# Patient Record
Sex: Female | Born: 1960 | Race: Asian | Hispanic: No | Marital: Married | Smoking: Never smoker
Health system: Southern US, Community
[De-identification: ages and names within clinical notes are randomized; demographics above are authoritative.]

## PROBLEM LIST (undated history)

## (undated) DIAGNOSIS — E785 Hyperlipidemia, unspecified: Secondary | ICD-10-CM

## (undated) DIAGNOSIS — I10 Essential (primary) hypertension: Secondary | ICD-10-CM

## (undated) HISTORY — DX: Essential (primary) hypertension: I10

## (undated) HISTORY — DX: Hyperlipidemia, unspecified: E78.5

---

## 2003-05-27 ENCOUNTER — Other Ambulatory Visit: Admission: RE | Admit: 2003-05-27 | Discharge: 2003-05-27 | Payer: Self-pay | Admitting: Gynecology

## 2005-11-29 ENCOUNTER — Other Ambulatory Visit: Admission: RE | Admit: 2005-11-29 | Discharge: 2005-11-29 | Payer: Self-pay | Admitting: Gynecology

## 2005-12-23 ENCOUNTER — Ambulatory Visit (HOSPITAL_COMMUNITY): Admission: RE | Admit: 2005-12-23 | Discharge: 2005-12-23 | Payer: Self-pay | Admitting: Gynecology

## 2006-12-27 ENCOUNTER — Encounter: Admission: RE | Admit: 2006-12-27 | Discharge: 2006-12-27 | Payer: Self-pay | Admitting: Family Medicine

## 2008-01-30 ENCOUNTER — Ambulatory Visit (HOSPITAL_COMMUNITY): Admission: RE | Admit: 2008-01-30 | Discharge: 2008-01-30 | Payer: Self-pay | Admitting: Obstetrics & Gynecology

## 2008-03-12 ENCOUNTER — Encounter: Admission: RE | Admit: 2008-03-12 | Discharge: 2008-03-12 | Payer: Self-pay | Admitting: Family Medicine

## 2009-03-05 ENCOUNTER — Encounter: Admission: RE | Admit: 2009-03-05 | Discharge: 2009-03-05 | Payer: Self-pay | Admitting: Family Medicine

## 2009-09-25 ENCOUNTER — Encounter: Admission: RE | Admit: 2009-09-25 | Discharge: 2009-09-25 | Payer: Self-pay | Admitting: Family Medicine

## 2010-02-04 ENCOUNTER — Ambulatory Visit: Payer: Self-pay | Admitting: Internal Medicine

## 2010-02-04 LAB — CONVERTED CEMR LAB
AST: 23 units/L (ref 0–37)
Basophils Relative: 1 % (ref 0–1)
CO2: 22 meq/L (ref 19–32)
Calcium: 9.1 mg/dL (ref 8.4–10.5)
Cholesterol: 223 mg/dL — ABNORMAL HIGH (ref 0–200)
Eosinophils Relative: 5 % (ref 0–5)
HCT: 40.7 % (ref 36.0–46.0)
Lymphocytes Relative: 33 % (ref 12–46)
Lymphs Abs: 1.4 10*3/uL (ref 0.7–4.0)
MCHC: 32.7 g/dL (ref 30.0–36.0)
MCV: 94 fL (ref 78.0–100.0)
Monocytes Absolute: 0.2 10*3/uL (ref 0.1–1.0)
Platelets: 260 10*3/uL (ref 150–400)
Potassium: 4 meq/L (ref 3.5–5.3)
Total CHOL/HDL Ratio: 3.3
Total Protein: 8 g/dL (ref 6.0–8.3)
VLDL: 10 mg/dL (ref 0–40)
WBC: 4.2 10*3/uL (ref 4.0–10.5)

## 2010-03-05 ENCOUNTER — Encounter (INDEPENDENT_AMBULATORY_CARE_PROVIDER_SITE_OTHER): Payer: Self-pay | Admitting: Internal Medicine

## 2010-03-05 LAB — CONVERTED CEMR LAB: Calcium: 9.7 mg/dL (ref 8.4–10.5)

## 2010-03-09 ENCOUNTER — Ambulatory Visit (HOSPITAL_COMMUNITY): Admission: RE | Admit: 2010-03-09 | Discharge: 2010-03-09 | Payer: Self-pay | Admitting: Internal Medicine

## 2010-07-05 ENCOUNTER — Encounter: Payer: Self-pay | Admitting: *Deleted

## 2010-10-12 ENCOUNTER — Other Ambulatory Visit: Payer: Self-pay | Admitting: Family Medicine

## 2011-03-01 ENCOUNTER — Other Ambulatory Visit (HOSPITAL_COMMUNITY): Payer: Self-pay | Admitting: Family Medicine

## 2011-03-01 DIAGNOSIS — Z1231 Encounter for screening mammogram for malignant neoplasm of breast: Secondary | ICD-10-CM

## 2011-03-15 ENCOUNTER — Ambulatory Visit (HOSPITAL_COMMUNITY)
Admission: RE | Admit: 2011-03-15 | Discharge: 2011-03-15 | Disposition: A | Discharge status: Home / Self Care | Payer: Self-pay | Source: Ambulatory Visit | Attending: Family Medicine | Admitting: Family Medicine

## 2011-03-15 DIAGNOSIS — Z1231 Encounter for screening mammogram for malignant neoplasm of breast: Secondary | ICD-10-CM

## 2011-03-19 ENCOUNTER — Other Ambulatory Visit: Payer: Self-pay | Admitting: Family Medicine

## 2011-03-19 DIAGNOSIS — R928 Other abnormal and inconclusive findings on diagnostic imaging of breast: Secondary | ICD-10-CM

## 2011-05-21 ENCOUNTER — Ambulatory Visit
Admission: RE | Admit: 2011-05-21 | Discharge: 2011-05-21 | Disposition: A | Discharge status: Home / Self Care | Payer: No Typology Code available for payment source | Source: Ambulatory Visit | Attending: Family Medicine | Admitting: Family Medicine

## 2011-05-21 DIAGNOSIS — R928 Other abnormal and inconclusive findings on diagnostic imaging of breast: Secondary | ICD-10-CM

## 2012-12-26 ENCOUNTER — Other Ambulatory Visit (HOSPITAL_COMMUNITY): Payer: Self-pay | Admitting: Nurse Practitioner

## 2012-12-26 DIAGNOSIS — Z1231 Encounter for screening mammogram for malignant neoplasm of breast: Secondary | ICD-10-CM

## 2013-01-12 ENCOUNTER — Ambulatory Visit: Payer: No Typology Code available for payment source | Attending: Family Medicine | Admitting: Internal Medicine

## 2013-01-12 ENCOUNTER — Ambulatory Visit: Payer: Self-pay | Attending: Family Medicine

## 2013-01-12 VITALS — BP 157/90 | HR 82 | Temp 97.7°F | Resp 18 | Wt 114.2 lb

## 2013-01-12 DIAGNOSIS — M25572 Pain in left ankle and joints of left foot: Secondary | ICD-10-CM

## 2013-01-12 DIAGNOSIS — M25579 Pain in unspecified ankle and joints of unspecified foot: Secondary | ICD-10-CM

## 2013-01-12 DIAGNOSIS — I10 Essential (primary) hypertension: Secondary | ICD-10-CM

## 2013-01-12 LAB — COMPREHENSIVE METABOLIC PANEL
ALT: 19 U/L (ref 0–35)
Alkaline Phosphatase: 51 U/L (ref 39–117)
BUN: 11 mg/dL (ref 6–23)
CO2: 25 mEq/L (ref 19–32)
Potassium: 3.8 mEq/L (ref 3.5–5.3)
Sodium: 138 mEq/L (ref 135–145)
Total Protein: 8.1 g/dL (ref 6.0–8.3)

## 2013-01-12 MED ORDER — LISINOPRIL 20 MG PO TABS: 20.0000 mg | ORAL_TABLET | Freq: Every day | ORAL | Status: DC

## 2013-01-12 NOTE — Progress Notes (Signed)
Patient ID: Amber Grant, female   DOB: 01/16/1961, 52 y.o.   MRN: [id number]  CC:  HPI:  52 year old female who is here to establish care She brings in records from [facility] She says that she has a upcoming appointment for a mammogram on August 4 She is up-to-date with her Pap smear She has no history of smoking or alcohol use She denies any chest pain any shortness of breath She has a history of hypertension and is compliant with taking her medications     No Known Allergies Past Medical History  Diagnosis Date  . Hypertension    No current outpatient prescriptions on file prior to visit.   No current facility-administered medications on file prior to visit.   History reviewed. No pertinent family history. History   Social History  . Marital Status: Single    Spouse Name: N/A    Number of Children: N/A  . Years of Education: N/A   Occupational History  . Not on file.   Social History Main Topics  . Smoking status: Not on file  . Smokeless tobacco: Not on file  . Alcohol Use: Not on file  . Drug Use: Not on file  . Sexually Active: Not on file   Other Topics Concern  . Not on file   Social History Narrative  . No narrative on file    Review of Systems  Constitutional: Negative for fever, chills, diaphoresis, activity change, appetite change and fatigue.  HENT: Negative for ear pain, nosebleeds, congestion, facial swelling, rhinorrhea, neck pain, neck stiffness and ear discharge.   Eyes: Negative for pain, discharge, redness, itching and visual disturbance.  Respiratory: Negative for cough, choking, chest tightness, shortness of breath, wheezing and stridor.   Cardiovascular: Negative for chest pain, palpitations and leg swelling.  Gastrointestinal: Negative for abdominal distention.  Genitourinary: Negative for dysuria, urgency, frequency, hematuria, flank pain, decreased urine volume, difficulty urinating and dyspareunia.  Musculoskeletal: Negative for  back pain, joint swelling, arthralgias and gait problem.  Neurological: Negative for dizziness, tremors, seizures, syncope, facial asymmetry, speech difficulty, weakness, light-headedness, numbness and headaches.  Hematological: Negative for adenopathy. Does not bruise/bleed easily.  Psychiatric/Behavioral: Negative for hallucinations, behavioral problems, confusion, dysphoric mood, decreased concentration and agitation.    Objective:   Filed Vitals:   01/12/13 1005  BP: 157/90  Pulse: 82  Temp: 97.7 F (36.5 C)  Resp: 18    Physical Exam  Constitutional: Appears well-developed and well-nourished. No distress.  HENT: Normocephalic. External right and left ear normal. Oropharynx is clear and moist.  Eyes: Conjunctivae and EOM are normal. PERRLA, no scleral icterus.  Neck: Normal ROM. Neck supple. No JVD. No tracheal deviation. No thyromegaly.  CVS: RRR, S1/S2 +, no murmurs, no gallops, no carotid bruit.  Pulmonary: Effort and breath sounds normal, no stridor, rhonchi, wheezes, rales.  Abdominal: Soft. BS +,  no distension, tenderness, rebound or guarding.  Musculoskeletal: Normal range of motion. No edema and no tenderness.  Lymphadenopathy: No lymphadenopathy noted, cervical, inguinal. Neuro: Alert. Normal reflexes, muscle tone coordination. No cranial nerve deficit. Skin: Skin is warm and dry. No rash noted. Not diaphoretic. No erythema. No pallor.  Psychiatric: Normal mood and affect. Behavior, judgment, thought content normal.   Lab Results  Component Value Date   WBC 4.2 02/04/2010   HGB 13.3 02/04/2010   HCT 40.7 02/04/2010   MCV 94.0 02/04/2010   PLT 260 02/04/2010   Lab Results  Component Value Date   CREATININE 0.62 03/05/2010  BUN 8 03/05/2010   NA 144 03/05/2010   K 4.7 03/05/2010   CL 105 03/05/2010   CO2 28 03/05/2010    No results found for this basename: HGBA1C   Lipid Panel     Component Value Date/Time   CHOL 223* 02/04/2010 2045   TRIG 50 02/04/2010 2045    HDL 67 02/04/2010 2045   CHOLHDL 3.3 Ratio 02/04/2010 2045   VLDL 10 02/04/2010 2045   LDLCALC 146* 02/04/2010 2045       Assessment and plan:   There are no active problems to display for this patient.  Hypertension Refill lisinopril  Healthcare maintenance Obtain blood work, CBC, CMP, hemoglobin A1c, lipid panel Mammogram on 8/4 according to the patient Patient follows up with women's health and will follow up with them for her regular Pap smears   Patient instructed to call in a couple of days to obtain the results of the blood work  Otherwise followup in one month

## 2013-01-12 NOTE — Progress Notes (Signed)
Patient has a history of HTN Needs medication refill

## 2013-01-12 NOTE — Patient Instructions (Signed)
Patient instructed to call back in 2 days, to obtain results of her blood work

## 2013-01-15 ENCOUNTER — Ambulatory Visit (HOSPITAL_COMMUNITY)
Admission: RE | Admit: 2013-01-15 | Discharge: 2013-01-15 | Disposition: A | Discharge status: Home / Self Care | Payer: Self-pay | Source: Ambulatory Visit | Attending: Nurse Practitioner | Admitting: Nurse Practitioner

## 2013-01-15 DIAGNOSIS — Z1231 Encounter for screening mammogram for malignant neoplasm of breast: Secondary | ICD-10-CM

## 2013-02-16 ENCOUNTER — Ambulatory Visit: Payer: No Typology Code available for payment source | Attending: Internal Medicine | Admitting: Internal Medicine

## 2013-02-16 VITALS — BP 165/91 | HR 78 | Temp 98.3°F | Resp 17 | Wt 114.2 lb

## 2013-02-16 DIAGNOSIS — R51 Headache: Secondary | ICD-10-CM | POA: Insufficient documentation

## 2013-02-16 DIAGNOSIS — R040 Epistaxis: Secondary | ICD-10-CM

## 2013-02-16 DIAGNOSIS — I10 Essential (primary) hypertension: Secondary | ICD-10-CM | POA: Insufficient documentation

## 2013-02-16 LAB — LIPID PANEL: Cholesterol: 251 mg/dL — ABNORMAL HIGH (ref 0–200)

## 2013-02-16 LAB — CBC WITH DIFFERENTIAL/PLATELET
Eosinophils Absolute: 0.2 10*3/uL (ref 0.0–0.7)
HCT: 39.5 % (ref 36.0–46.0)
Hemoglobin: 13.5 g/dL (ref 12.0–15.0)
Lymphs Abs: 2.2 10*3/uL (ref 0.7–4.0)
MCHC: 34.2 g/dL (ref 30.0–36.0)
Neutrophils Relative %: 43 % (ref 43–77)
Platelets: 304 10*3/uL (ref 150–400)
RBC: 4.51 MIL/uL (ref 3.87–5.11)
RDW: 13.4 % (ref 11.5–15.5)
WBC: 4.8 10*3/uL (ref 4.0–10.5)

## 2013-02-16 MED ORDER — LISINOPRIL 40 MG PO TABS: 40.0000 mg | ORAL_TABLET | Freq: Every day | ORAL | Status: DC

## 2013-02-16 MED ORDER — OMEGA-3 FATTY ACIDS 1000 MG PO CAPS: 2.0000 g | ORAL_CAPSULE | Freq: Every day | ORAL | Status: DC

## 2013-02-16 NOTE — Progress Notes (Signed)
PATIENT here for headaches and repeated nose bleeds Has been going on for about 3 months

## 2013-02-16 NOTE — Progress Notes (Signed)
Patient ID: Amber Grant, female   DOB: 03/31/1961, 52 y.o.   MRN: [id number] Patient Demographics  Amber Grant, is a 52 y.o. female  [id number]  [id number]  DOB - 1960-11-14  Chief Complaint  Patient presents with  . Headache        Subjective:   Amber Grant today is here for a follow up visit. Patient reports that she's been having intermittent epistaxis, her BP has been running high, intermittent headaches. No blurred vision or any chest pain. No allergies or sinusitis. She states that epistaxis is from the right nostril, usually spontaneously resolves and is mild.  Patient has No chest pain, No abdominal pain - No Nausea, No new weakness tingling or numbness, No Cough - SOB.  Objective:    Filed Vitals:   02/16/13 0906  BP: 165/91  Pulse: 78  Temp: 98.3 F (36.8 C)  Resp: 17  Weight: 114 lb 3.2 oz (51.801 kg)  SpO2: 100%     ALLERGIES:  No Known Allergies  PAST MEDICAL HISTORY: Past Medical History  Diagnosis Date  . Hypertension     MEDICATIONS AT HOME: Prior to Admission medications   Medication Sig Start Date End Date Taking? Authorizing Provider  fish oil-omega-3 fatty acids 1000 MG capsule Take 2 capsules (2 g total) by mouth daily. 02/16/13   Madge Therrien Jenna Luo, MD  lisinopril (PRINIVIL,ZESTRIL) 40 MG tablet Take 1 tablet (40 mg total) by mouth daily. 02/16/13   Prabhav Faulkenberry Jenna Luo, MD  Multiple Vitamins-Minerals (MULTIVITAMIN PO) Take by mouth.    Historical Provider, MD     Exam  General appearance :Awake, alert, NAD, Speech Clear.  HEENT: Atraumatic and Normocephalic, PERLA. Both nostrils clean no polyps, no dry blood noted Neck: supple, no JVD. No cervical lymphadenopathy.  Chest: Clear to auscultation bilaterally, no wheezing, rales or rhonchi CVS: S1 S2 regular, no murmurs.  Abdomen: soft, NBS, NT, ND, no gaurding, rigidity or rebound. Extremities: no cyanosis or clubbing, B/L Lower Ext shows no edema Neurology: Awake alert, and oriented X 3, CN  II-XII intact, Non focal Skin: No Rash or lesions Wounds:N/A    Data Review   Basic Metabolic Panel: No results found for this basename: NA, K, CL, CO2, GLUCOSE, BUN, CREATININE, CALCIUM, MG, PHOS,  in the last 168 hours Liver Function Tests: No results found for this basename: AST, ALT, ALKPHOS, BILITOT, PROT, ALBUMIN,  in the last 168 hours  CBC: No results found for this basename: WBC, NEUTROABS, HGB, HCT, MCV, PLT,  in the last 168 hours  ------------------------------------------------------------------------------------------------------------------ No results found for this basename: HGBA1C,  in the last 72 hours ------------------------------------------------------------------------------------------------------------------ No results found for this basename: CHOL, HDL, LDLCALC, TRIG, CHOLHDL, LDLDIRECT,  in the last 72 hours ------------------------------------------------------------------------------------------------------------------ No results found for this basename: TSH, T4TOTAL, FREET3, T3FREE, THYROIDAB,  in the last 72 hours ------------------------------------------------------------------------------------------------------------------ No results found for this basename: VITAMINB12, FOLATE, FERRITIN, TIBC, IRON, RETICCTPCT,  in the last 72 hours  Coagulation profile  No results found for this basename: INR, PROTIME,  in the last 168 hours    Assessment & Plan   Active Problems: Intermittent epistaxis with headaches and uncontrolled hypertension - Will increase lisinopril to 40 mg daily - Patient advised to STOP aspirin - Recommended patient to check BP every morning, not blow her nose. - Check CBC, lipid panel today - If no significant improvement despite good BP control, will obtain ENT consult next visit  History of hyperlipidemia:  - Patient has been on  omega-3 fatty acids - Check lipid panel today  Recommendations: CBC, lipid panel Follow-up in  2 months     Obi Scrima M.D. 02/16/2013, 9:19 AM

## 2013-02-20 ENCOUNTER — Telehealth: Payer: Self-pay | Admitting: Emergency Medicine

## 2013-02-20 MED ORDER — SIMVASTATIN 40 MG PO TABS: 40.0000 mg | ORAL_TABLET | Freq: Every day | ORAL | Status: DC

## 2013-02-20 NOTE — Telephone Encounter (Signed)
Pt told to pick Zocor script from [facility]

## 2013-02-20 NOTE — Progress Notes (Signed)
Quick Note:  Please order zocor (simvastatin) 40mg  at bed time with 3 refills. ______

## 2013-03-19 ENCOUNTER — Ambulatory Visit: Payer: No Typology Code available for payment source

## 2013-04-19 ENCOUNTER — Encounter: Payer: Self-pay | Admitting: Internal Medicine

## 2013-04-19 ENCOUNTER — Ambulatory Visit: Payer: No Typology Code available for payment source | Attending: Internal Medicine | Admitting: Internal Medicine

## 2013-04-19 VITALS — BP 126/84 | HR 90 | Temp 98.7°F | Resp 16 | Ht 60.0 in | Wt 119.0 lb

## 2013-04-19 DIAGNOSIS — R040 Epistaxis: Secondary | ICD-10-CM

## 2013-04-19 DIAGNOSIS — I10 Essential (primary) hypertension: Secondary | ICD-10-CM | POA: Insufficient documentation

## 2013-04-19 LAB — APTT: aPTT: 33 seconds (ref 24–37)

## 2013-04-19 LAB — HEPATIC FUNCTION PANEL
ALT: 17 U/L (ref 0–35)
Alkaline Phosphatase: 46 U/L (ref 39–117)
Bilirubin, Direct: 0.1 mg/dL (ref 0.0–0.3)
Indirect Bilirubin: 0.5 mg/dL (ref 0.0–0.9)

## 2013-04-19 LAB — PROTIME-INR: INR: 0.97 (ref <1.50)

## 2013-04-19 MED ORDER — OMEGA-3 FATTY ACIDS 1000 MG PO CAPS: 2.0000 g | ORAL_CAPSULE | Freq: Every day | ORAL | Status: DC

## 2013-04-19 MED ORDER — SIMVASTATIN 40 MG PO TABS: 40.0000 mg | ORAL_TABLET | Freq: Every day | ORAL | Status: DC

## 2013-04-19 MED ORDER — LISINOPRIL 40 MG PO TABS: 40.0000 mg | ORAL_TABLET | Freq: Every day | ORAL | Status: DC

## 2013-04-19 NOTE — Progress Notes (Signed)
Patient ID: Amber Grant, female   DOB: 07/27/1960, 52 y.o.   MRN: [id number] Patient Demographics  Amber Grant, is a 52 y.o. female  [id number]  [id number]  DOB - 05/31/1961  Chief Complaint  Patient presents with  . Follow-up        Subjective:   Amber Grant is a 52 y.o. female here today for a follow up visit. Still has epistaxis occasionally, last episode was about a week ago, blood pressure now controlled. No new complaints. She needs refill on her medications Patient has No headache, No chest pain, No abdominal pain - No Nausea, No new weakness tingling or numbness, No Cough - SOB.  ALLERGIES: No Known Allergies  PAST MEDICAL HISTORY: Past Medical History  Diagnosis Date  . Hypertension     MEDICATIONS AT HOME: Prior to Admission medications   Medication Sig Start Date End Date Taking? Authorizing Provider  fish oil-omega-3 fatty acids 1000 MG capsule Take 2 capsules (2 g total) by mouth daily. 02/16/13  Yes Ripudeep Jenna Luo, MD  lisinopril (PRINIVIL,ZESTRIL) 40 MG tablet Take 1 tablet (40 mg total) by mouth daily. 02/16/13  Yes Ripudeep Jenna Luo, MD  Multiple Vitamins-Minerals (MULTIVITAMIN PO) Take by mouth.   Yes Historical Provider, MD  simvastatin (ZOCOR) 40 MG tablet Take 1 tablet (40 mg total) by mouth at bedtime. 02/20/13   Ripudeep Jenna Luo, MD     Objective:   Filed Vitals:   04/19/13 1032  BP: 126/84  Pulse: 90  Temp: 98.7 F (37.1 C)  TempSrc: Oral  Resp: 16  Height: 5' (1.524 m)  Weight: 119 lb (53.978 kg)  SpO2: 99%    Exam General appearance : Awake, alert, not in any distress. Speech Clear. Not toxic looking HEENT: Atraumatic and Normocephalic, pupils equally reactive to light and accomodation Neck: supple, no JVD. No cervical lymphadenopathy.  Chest:Good air entry bilaterally, no added sounds  CVS: S1 S2 regular, no murmurs.  Abdomen: Bowel sounds present, Non tender and not distended with no gaurding, rigidity or rebound. Extremities: B/L  Lower Ext shows no edema, both legs are warm to touch Neurology: Awake alert, and oriented X 3, CN II-XII intact, Non focal Skin:No Rash Wounds:N/A   Data Review   CBC No results found for this basename: WBC, HGB, HCT, PLT, MCV, MCH, MCHC, RDW, NEUTRABS, LYMPHSABS, MONOABS, EOSABS, BASOSABS, BANDABS, BANDSABD,  in the last 168 hours  Chemistries   No results found for this basename: NA, K, CL, CO2, GLUCOSE, BUN, CREATININE, GFRCGP, CALCIUM, MG, AST, ALT, ALKPHOS, BILITOT,  in the last 168 hours ------------------------------------------------------------------------------------------------------------------ No results found for this basename: HGBA1C,  in the last 72 hours ------------------------------------------------------------------------------------------------------------------ No results found for this basename: CHOL, HDL, LDLCALC, TRIG, CHOLHDL, LDLDIRECT,  in the last 72 hours ------------------------------------------------------------------------------------------------------------------ No results found for this basename: TSH, T4TOTAL, FREET3, T3FREE, THYROIDAB,  in the last 72 hours ------------------------------------------------------------------------------------------------------------------ No results found for this basename: VITAMINB12, FOLATE, FERRITIN, TIBC, IRON, RETICCTPCT,  in the last 72 hours  Coagulation profile  No results found for this basename: INR, PROTIME,  in the last 168 hours    Assessment & Plan   Patient Active Problem List   Diagnosis Date Noted  . Essential hypertension, benign 02/16/2013  . Epistaxis-mild intermittent 02/16/2013     Plan: Liver function test PT/INR APTT  Patient counseled extensively about nutrition and exercise Patient advised not to use aspirin or over-the-counter NSAID Patient advised not to below her nose, and wipe gently if rhinorrhea  Follow up in 4 weeks   The patient was given clear instructions to go  to ER or return to medical center if symptoms don't improve, worsen or new problems develop. The patient verbalized understanding. The patient was told to call to get lab results if they haven't heard anything in the next week.    Jeanann Lewandowsky, MD, MHA, FACP, FAAP [facility] [place], [place] [phone]   04/19/2013, 11:13 AM

## 2013-04-19 NOTE — Progress Notes (Signed)
Pt is here for a f/u visit. Pt reports that for three months she has been having epistaxis.  Marland Kitchen

## 2013-04-19 NOTE — Patient Instructions (Signed)
Ch?y Máu Cam  (Nosebleed)  Ch?y máu cam có th? do nhi?u tình tr?ng bao g?m ch?n th??ng, nhi?m trùng, pô líp, d? v?t, niêm m?c ho?c khí h?u khô, thu?c và ?i?u hòa không khí. H?u h?t ch?y máu cam x?y ra ? ph?n tr??c m?i. Vì ? v? trí này nên h?u h?t ch?y máu cam có th? ???c ki?m soát b?ng k?p l? m?i nh? nhàng và liên t?c. Làm ?i?u này trong ít nh?t là 10 ??n 20 phút. Lý do s? d?ng áp l?c này liên t?c và lâu là vì b?n ph?i gi? nó ?? lâu ?? máu ?ông l?i. N?u trong kho?ng 10 ??n 20 phút ?ó, áp l?c không ???c duy trì, quá trình này có th? ph?i b?t ??u l?i. Vi?c ch?y máu cam có th? t? d?ng, h?t ch?y do ép, c?n nhi?t t?p trung (??t) ho?c d?ng khi ép b?ng kh?n ??t.  H??NG D?N CH?M SÓC T?I NHÀ  · N?u m?i ???c nút l?i, b?n hãy c? g?ng gi? nguyên nút ? bên trong cho ??n khi chuyên gia ch?m sóc s?c kh?e l?y ra. N?u gói g?c ?ã ???c s? d?ng và nó b?t ??u r?i ra, hãy nh? nhàng thay ho?c c?t b? ??u. Không c?t n?u m?t ?ng có bóng chèn ???c s? d?ng ?? bó m?i. N?u không, không ???c tháo tr? khi ???c ch? d?n.  · Tránh xì m?i trong 12 gi? sau khi ?i?u tr?. Làm nh? v?y có th? làm b?t nút ho?c c?c chèn và s? ch?y máu l?i.  · N?u l?i ch?y máu l?i, hãy ??ng d?y và cúi ng??i v? phía tr??c, nh? nhàng bóp ph?n n?a tr??c c?a m?i liên t?c trong hai m??i phút.  · N?u ch?y máu là do l?p màng nh?y khô, m?i sáng dùng ??u ngón út ?? bôi m? vaseline ho?c thu?c m? kháng sinh ph?n bên trong m?i. Làm nh? v?y khi c?n thi?t khi th?i ti?t khô. Cách này s? giúp gi? l?p niêm m?c ?m ??t và giúp chúng lành l?i.  · Duy trì ?? ?m trong nhà b?n b?ng cách s? d?ng máy ?i?u hoà nhi?t ?? ít h?n ho?c dùng máy t?o ?? ?m.  · Không u?ng aspirin ho?c nh?ng lo?i thu?c d? làm ch?y máu. Chuyên gia ch?m sóc s?c kh?e c?a b?n có th? khuyên b?n ?i?u này.  · Tr? l?i sinh ho?t bình th??ng khi có th?, nh?ng c? g?ng tránh c?ng th?ng, nâng ho?c cong ng??i ? ph?n th?t l?ng trong vài ngày.  · N?u b?nh ch?y máu cam c?a b?n b?t ??u tái phát và không có nguyên nhân rõ ràng, chuyên gia ch?m  sóc s?c kh?e c?a b?n có th? ?? ngh? làm xét nghi?m.  HÃY ??N KHÁM B?NH NGAY L?P T?C N?U:  · Ch?y máu l?i tái phát và không th? ki?m soát ?u?c.  · Có hi?n t??ng ch?y máu ho?c thâm tím b?t th??ng trên nh?ng b? ph?n khác c?a c? th?.  · B?n b? s?t.  · V?n ti?p t?c ch?y máu cam.  · Tình tr?ng làm b?n ph?i ?i khám lúc ??u ngày càng tr?m tr?ng h?n.  · B?n b? choáng váng, c?m th?y nh? b? ng?t, ?? m? hôi, ho?c nôn ra máu.  HÃY CH?C CH?N R?NG B?N:  · Hi?u rõ nh?ng h??ng d?n này.  · S? theo dõi tình tr?ng b?nh c?a b?n.  · S? yêu c?u tr? giúp ngay l?p t?c n?u b?n không ?? ho?c tình tr?ng tr?m tr?ng h?n.  Document Released:   03/10/2005 Document Revised: 01/31/2013  [employer]® Patient Information ©2014 [employer].

## 2013-04-20 ENCOUNTER — Telehealth: Payer: Self-pay | Admitting: Emergency Medicine

## 2013-04-20 NOTE — Telephone Encounter (Signed)
Message copied by Darlis Loan on Fri Apr 20, 2013  2:16 PM ------      Message from: Jeanann Lewandowsky E      Created: Fri Apr 20, 2013 12:00 PM       Please let patient know that her liver function test and coagulation profiles are normal. We advise patient not to blow her nose, don't take aspirin for now, if epistaxis continues, to report back to the clinic ------

## 2013-04-20 NOTE — Telephone Encounter (Signed)
Message copied by Darlis Loan on Fri Apr 20, 2013  2:15 PM ------      Message from: Jeanann Lewandowsky E      Created: Fri Apr 20, 2013 12:00 PM       Please let patient know that her liver function test and coagulation profiles are normal. We advise patient not to blow her nose, don't take aspirin for now, if epistaxis continues, to report back to the clinic ------

## 2013-04-20 NOTE — Telephone Encounter (Signed)
Attempted to reach pt with results. Line is not set up for voicemail. Will try again

## 2013-04-30 ENCOUNTER — Ambulatory Visit: Payer: No Typology Code available for payment source | Attending: Internal Medicine

## 2013-05-24 ENCOUNTER — Encounter: Payer: Self-pay | Admitting: Internal Medicine

## 2013-05-24 ENCOUNTER — Ambulatory Visit: Payer: No Typology Code available for payment source | Attending: Internal Medicine | Admitting: Internal Medicine

## 2013-05-24 VITALS — BP 150/89 | HR 90 | Temp 98.2°F | Resp 16 | Ht 60.0 in | Wt 119.0 lb

## 2013-05-24 DIAGNOSIS — R040 Epistaxis: Secondary | ICD-10-CM

## 2013-05-24 DIAGNOSIS — I10 Essential (primary) hypertension: Secondary | ICD-10-CM | POA: Insufficient documentation

## 2013-05-24 NOTE — Progress Notes (Signed)
Pt is here following up on her HTN and epistaxis. Today she reports that the nose bleeds are getting better but the is still some bleeding. Pt has an interpretor with her today.

## 2013-05-24 NOTE — Progress Notes (Signed)
Patient ID: Amber Grant, female   DOB: October 04, 1960, 52 y.o.   MRN: [id number] Patient Demographics  Amber Grant, is a 52 y.o. female  [id number]  [id number]  DOB - August 03, 1960  Chief Complaint  Patient presents with  . Follow-up        Subjective:   Amber Grant is a 52 y.o. female here today for a follow up visit. Patient continue to have epistaxis but the frequency is reduced, small quantity of bleeding at the time. Patient claims her blood pressure is better controlled at this time, but preparing for this clinic visit she did not sleep last night, she thinks that may be the result of her blood pressure being high. She has no new complaints. No headache. No bleeding anywhere else. She does not smoke cigarette she does not drink alcohol. Patient has No headache, No chest pain, No abdominal pain - No Nausea, No new weakness tingling or numbness, No Cough - SOB.  ALLERGIES: No Known Allergies  PAST MEDICAL HISTORY: Past Medical History  Diagnosis Date  . Hypertension     MEDICATIONS AT HOME: Prior to Admission medications   Medication Sig Start Date End Date Taking? Authorizing Provider  fish oil-omega-3 fatty acids 1000 MG capsule Take 2 capsules (2 g total) by mouth daily. 04/19/13  Yes Jeanann Lewandowsky, MD  lisinopril (PRINIVIL,ZESTRIL) 40 MG tablet Take 1 tablet (40 mg total) by mouth daily. 04/19/13  Yes Jeanann Lewandowsky, MD  Multiple Vitamins-Minerals (MULTIVITAMIN PO) Take by mouth.   Yes Historical Provider, MD  simvastatin (ZOCOR) 40 MG tablet Take 1 tablet (40 mg total) by mouth at bedtime. 04/19/13   Jeanann Lewandowsky, MD     Objective:   Filed Vitals:   05/24/13 0908  BP: 150/89  Pulse: 90  Temp: 98.2 F (36.8 C)  TempSrc: Oral  Resp: 16  Height: 5' (1.524 m)  Weight: 119 lb (53.978 kg)  SpO2: 98%    Exam General appearance : Awake, alert, not in any distress. Speech Clear. Not toxic looking HEENT: Atraumatic and Normocephalic, pupils equally  reactive to light and accomodation, small area of bleeding/abrasion seen in both nostrils. Neck: supple, no JVD. No cervical lymphadenopathy.  Chest:Good air entry bilaterally, no added sounds  CVS: S1 S2 regular, no murmurs.  Abdomen: Bowel sounds present, Non tender and not distended with no gaurding, rigidity or rebound. Extremities: B/L Lower Ext shows no edema, both legs are warm to touch Neurology: Awake alert, and oriented X 3, CN II-XII intact, Non focal Skin:No Rash Wounds:N/A   Data Review   CBC No results found for this basename: WBC, HGB, HCT, PLT, MCV, MCH, MCHC, RDW, NEUTRABS, LYMPHSABS, MONOABS, EOSABS, BASOSABS, BANDABS, BANDSABD,  in the last 168 hours  Chemistries   No results found for this basename: NA, K, CL, CO2, GLUCOSE, BUN, CREATININE, GFRCGP, CALCIUM, MG, AST, ALT, ALKPHOS, BILITOT,  in the last 168 hours ------------------------------------------------------------------------------------------------------------------ No results found for this basename: HGBA1C,  in the last 72 hours ------------------------------------------------------------------------------------------------------------------ No results found for this basename: CHOL, HDL, LDLCALC, TRIG, CHOLHDL, LDLDIRECT,  in the last 72 hours ------------------------------------------------------------------------------------------------------------------ No results found for this basename: TSH, T4TOTAL, FREET3, T3FREE, THYROIDAB,  in the last 72 hours ------------------------------------------------------------------------------------------------------------------ No results found for this basename: VITAMINB12, FOLATE, FERRITIN, TIBC, IRON, RETICCTPCT,  in the last 72 hours  Coagulation profile  No results found for this basename: INR, PROTIME,  in the last 168 hours    Assessment & Plan   1. Essential hypertension, benign  Continue lisinopril 40 mg tablet by mouth daily Continue other medications  for dyslipidemia  2. Epistaxis All coagulation profile were within normal limits - Ambulatory referral to ENT   Follow up in 3 months or when necessary Interpreter was used to communicate directly with patient for the entire encounter including providing detailed patient instructions.   The patient was given clear instructions to go to ER or return to medical center if symptoms don't improve, worsen or new problems develop. The patient verbalized understanding. The patient was told to call to get lab results if they haven't heard anything in the next week.    Jeanann Lewandowsky, MD, MHA, FACP, FAAP [facility] [place], [place] [phone]   05/24/2013, 10:03 AM

## 2013-06-04 ENCOUNTER — Ambulatory Visit: Payer: No Typology Code available for payment source | Attending: Internal Medicine

## 2013-08-23 ENCOUNTER — Encounter: Payer: Self-pay | Admitting: Pharmacist

## 2013-08-23 ENCOUNTER — Ambulatory Visit: Payer: Self-pay

## 2013-08-23 ENCOUNTER — Ambulatory Visit: Payer: No Typology Code available for payment source | Attending: Internal Medicine | Admitting: Pharmacist

## 2013-08-23 VITALS — BP 115/74 | HR 72 | Ht 61.0 in | Wt 117.0 lb

## 2013-08-23 DIAGNOSIS — Z0000 Encounter for general adult medical examination without abnormal findings: Secondary | ICD-10-CM

## 2013-08-23 DIAGNOSIS — M25579 Pain in unspecified ankle and joints of unspecified foot: Secondary | ICD-10-CM

## 2013-08-23 DIAGNOSIS — M25572 Pain in left ankle and joints of left foot: Secondary | ICD-10-CM

## 2013-08-23 DIAGNOSIS — I10 Essential (primary) hypertension: Secondary | ICD-10-CM

## 2013-08-23 LAB — CBC WITH DIFFERENTIAL/PLATELET
BASOS ABS: 0 10*3/uL (ref 0.0–0.1)
BASOS PCT: 1 % (ref 0–1)
EOS ABS: 0.2 10*3/uL (ref 0.0–0.7)
Eosinophils Relative: 4 % (ref 0–5)
HCT: 38.9 % (ref 36.0–46.0)
HEMOGLOBIN: 13.3 g/dL (ref 12.0–15.0)
Lymphocytes Relative: 42 % (ref 12–46)
Lymphs Abs: 1.8 10*3/uL (ref 0.7–4.0)
MCH: 30.2 pg (ref 26.0–34.0)
MCHC: 34.2 g/dL (ref 30.0–36.0)
MCV: 88.4 fL (ref 78.0–100.0)
MONOS PCT: 6 % (ref 3–12)
Monocytes Absolute: 0.3 10*3/uL (ref 0.1–1.0)
NEUTROS ABS: 2 10*3/uL (ref 1.7–7.7)
Neutrophils Relative %: 47 % (ref 43–77)
Platelets: 284 10*3/uL (ref 150–400)
RBC: 4.4 MIL/uL (ref 3.87–5.11)
RDW: 13.3 % (ref 11.5–15.5)
WBC: 4.2 10*3/uL (ref 4.0–10.5)

## 2013-08-23 LAB — LIPID PANEL
Cholesterol: 286 mg/dL — ABNORMAL HIGH (ref 0–200)
HDL: 60 mg/dL (ref >39)
LDL CALC: 200 mg/dL — AB (ref 0–99)
Total CHOL/HDL Ratio: 4.8 Ratio
Triglycerides: 131 mg/dL (ref <150)
VLDL: 26 mg/dL (ref 0–40)

## 2013-08-23 LAB — COMPREHENSIVE METABOLIC PANEL
ALT: 22 U/L (ref 0–35)
AST: 22 U/L (ref 0–37)
Albumin: 4.6 g/dL (ref 3.5–5.2)
Alkaline Phosphatase: 44 U/L (ref 39–117)
BUN: 11 mg/dL (ref 6–23)
CALCIUM: 9.3 mg/dL (ref 8.4–10.5)
CHLORIDE: 101 meq/L (ref 96–112)
CO2: 27 meq/L (ref 19–32)
Creat: 0.49 mg/dL — ABNORMAL LOW (ref 0.50–1.10)
Glucose, Bld: 102 mg/dL — ABNORMAL HIGH (ref 70–99)
POTASSIUM: 4.2 meq/L (ref 3.5–5.3)
Sodium: 138 mEq/L (ref 135–145)
Total Bilirubin: 0.6 mg/dL (ref 0.2–1.2)
Total Protein: 7.6 g/dL (ref 6.0–8.3)

## 2013-08-23 LAB — TSH: TSH: 1.976 u[IU]/mL (ref 0.350–4.500)

## 2013-08-23 MED ORDER — LISINOPRIL 40 MG PO TABS: 40.0000 mg | ORAL_TABLET | Freq: Every day | ORAL | Status: DC

## 2013-08-23 NOTE — Progress Notes (Signed)
S:    Patient arrives to the clinic for ambulatory blood pressure evaluation.   Medication compliance is pt taking daily.  Current BP Medications include:  Lisinopril 40 mg.  Antihypertensives tried in the past include: N/A  Patient returned to the clinic and reported no symptoms reported at this time.  O:  Last 3 Office BP readings: 150/89 mmHg 126/84 mmHg 165/91 mmHg  Today's Office BP reading: 115/ mmHg   BMET    Component Value Date/Time   NA 138 01/12/2013 1059   K 3.8 01/12/2013 1059   CL 102 01/12/2013 1059   CO2 25 01/12/2013 1059   GLUCOSE 102* 01/12/2013 1059   BUN 11 01/12/2013 1059   CREATININE 0.58 01/12/2013 1059   CREATININE 0.62 03/05/2010 2028   CALCIUM 9.5 01/12/2013 1059    A/P:  There are no changes at this time.  BP is at goal this visit (<140/90).  Pt has not been taking simvastatin 40 mg because she felt she did not need it. Will obtain labs today CMP, Lipid, TSH, CBC

## 2013-11-30 ENCOUNTER — Ambulatory Visit: Payer: No Typology Code available for payment source

## 2013-12-12 ENCOUNTER — Ambulatory Visit: Payer: Self-pay | Attending: Internal Medicine

## 2014-07-05 ENCOUNTER — Ambulatory Visit: Payer: Self-pay

## 2014-07-11 ENCOUNTER — Ambulatory Visit: Payer: Self-pay | Attending: Internal Medicine

## 2014-07-18 ENCOUNTER — Encounter: Payer: Self-pay | Admitting: Internal Medicine

## 2014-07-18 ENCOUNTER — Ambulatory Visit: Payer: Self-pay | Attending: Internal Medicine | Admitting: Internal Medicine

## 2014-07-18 VITALS — BP 146/89 | HR 76 | Temp 98.1°F | Resp 16 | Ht 61.0 in | Wt 118.0 lb

## 2014-07-18 DIAGNOSIS — I10 Essential (primary) hypertension: Secondary | ICD-10-CM

## 2014-07-18 DIAGNOSIS — Z0000 Encounter for general adult medical examination without abnormal findings: Secondary | ICD-10-CM

## 2014-07-18 DIAGNOSIS — E785 Hyperlipidemia, unspecified: Secondary | ICD-10-CM

## 2014-07-18 MED ORDER — LISINOPRIL 40 MG PO TABS: 40.0000 mg | ORAL_TABLET | Freq: Every day | ORAL | Status: DC

## 2014-07-18 MED ORDER — SIMVASTATIN 40 MG PO TABS: 40.0000 mg | ORAL_TABLET | Freq: Every day | ORAL | Status: DC

## 2014-07-18 NOTE — Progress Notes (Signed)
Pt is here for a physical and a pap smear. Pt has an interpreter.

## 2014-07-18 NOTE — Progress Notes (Signed)
Patient ID: Amber Grant, female   DOB: 04-15-1961, 54 y.o.   MRN: [id number]  CC: physical  HPI: Amber Grant is a postmenopausal 54 y.o. female here today for a follow up visit.  Patient has past medical history of hypertension and hyperlipidemia.  Patient presents today for a physical and pap smear.  Her last mammogram was in 2014 which was normal.  She denies vaginal discharge, itch, odor, lesions, breast masses, or tenderness.  Surgical: c-sections Social: Denies tobacco, drug, or alcohol use. She works part time, is unmarried with 2 children.    Patient has No headache, No chest pain, No abdominal pain - No Nausea, No new weakness tingling or numbness, No Cough - SOB.  No Known Allergies Past Medical History  Diagnosis Date  . Hypertension    Current Outpatient Prescriptions on File Prior to Visit  Medication Sig Dispense Refill  . lisinopril (PRINIVIL,ZESTRIL) 40 MG tablet Take 1 tablet (40 mg total) by mouth daily. 30 tablet 3  . fish oil-omega-3 fatty acids 1000 MG capsule Take 2 capsules (2 g total) by mouth daily. (Patient not taking: Reported on 07/18/2014) 90 capsule 3  . Multiple Vitamins-Minerals (MULTIVITAMIN PO) Take by mouth.    . simvastatin (ZOCOR) 40 MG tablet Take 1 tablet (40 mg total) by mouth at bedtime. (Patient not taking: Reported on 07/18/2014) 90 tablet 3   No current facility-administered medications on file prior to visit.   History reviewed. No pertinent family history. History   Social History  . Marital Status: Single    Spouse Name: N/A    Number of Children: N/A  . Years of Education: N/A   Occupational History  . Not on file.   Social History Main Topics  . Smoking status: Never Smoker   . Smokeless tobacco: Not on file  . Alcohol Use: No  . Drug Use: No  . Sexual Activity: Not on file   Other Topics Concern  . Not on file   Social History Narrative    Review of Systems: Constitutional: Negative for fever, chills, diaphoresis,  activity change, appetite change and fatigue. HENT: Negative for ear pain, nosebleeds, congestion, facial swelling, rhinorrhea, neck pain, neck stiffness and ear discharge.  Eyes: Negative for pain, discharge, redness, itching and visual disturbance. Respiratory: Negative for cough, choking, chest tightness, shortness of breath, wheezing and stridor.  Cardiovascular: Negative for chest pain, palpitations and leg swelling. Gastrointestinal: Negative for abdominal distention. Genitourinary: Negative for dysuria, urgency, frequency, hematuria, flank pain, decreased urine volume, difficulty urinating and dyspareunia.  Musculoskeletal: Negative for back pain, joint swelling, arthralgias and gait problem. Neurological: Negative for dizziness, tremors, seizures, syncope, facial asymmetry, speech difficulty, weakness, light-headedness, numbness and headaches.  Hematological: Negative for adenopathy. Does not bruise/bleed easily. Psychiatric/Behavioral: Negative for hallucinations, behavioral problems, confusion, dysphoric mood, decreased concentration and agitation.    Objective:   Filed Vitals:   07/18/14 1621  BP: 146/89  Pulse: 76  Temp: 98.1 F (36.7 C)  Resp: 16    Physical Exam: Constitutional: Patient appears well-developed and well-nourished. No distress. HENT: Normocephalic, atraumatic, External right and left ear normal. Oropharynx is clear and moist.  Eyes: Conjunctivae and EOM are normal. PERRLA, no scleral icterus. Neck: Normal ROM. Neck supple. No JVD. No tracheal deviation. No thyromegaly. CVS: RRR, S1/S2 +, no murmurs, no gallops, no carotid bruit.  Pulmonary: Effort and breath sounds normal, no stridor, rhonchi, wheezes, rales.  Abdominal: Soft. BS +,  no distension, tenderness, rebound or guarding.  Musculoskeletal:  Normal range of motion. No edema and no tenderness.  Lymphadenopathy: No lymphadenopathy noted, cervical, Neuro: Alert. Normal reflexes, muscle tone  coordination. No cranial nerve deficit. Skin: Skin is warm and dry. No rash noted. Not diaphoretic. No erythema. No pallor. Psychiatric: Normal mood and affect. Behavior, judgment, thought content normal.  Physical Exam  Pulmonary/Chest: Right breast exhibits no mass. Left breast exhibits no mass.  Genitourinary: Rectum normal, vagina normal and uterus normal. No breast tenderness or discharge. Cervix exhibits no motion tenderness, no discharge and no friability.  Lymphadenopathy:       Right: No inguinal adenopathy present.       Left: No inguinal adenopathy present.     Lab Results  Component Value Date   WBC 4.2 08/23/2013   HGB 13.3 08/23/2013   HCT 38.9 08/23/2013   MCV 88.4 08/23/2013   PLT 284 08/23/2013   Lab Results  Component Value Date   CREATININE 0.49* 08/23/2013   BUN 11 08/23/2013   NA 138 08/23/2013   K 4.2 08/23/2013   CL 101 08/23/2013   CO2 27 08/23/2013    Lab Results  Component Value Date   HGBA1C 5.7* 01/12/2013   Lipid Panel     Component Value Date/Time   CHOL 286* 08/23/2013 0957   TRIG 131 08/23/2013 0957   HDL 60 08/23/2013 0957   CHOLHDL 4.8 08/23/2013 0957   VLDL 26 08/23/2013 0957   LDLCALC 200* 08/23/2013 0957       Assessment and plan:   Amber Grant was seen today for follow-up.  Diagnoses and all orders for this visit:  Essential hypertension, benign Orders: -     Refill lisinopril (PRINIVIL,ZESTRIL) 40 MG tablet; Take 1 tablet (40 mg total) by mouth daily. Will defer any additional treatment changes to PCP  Hyperlipidemia  -     simvastatin (ZOCOR) 40 MG tablet; Take 1 tablet (40 mg total) by mouth at bedtime. For cholesterol  Annual physical exam Orders: -     Lipid panel; Future -     CBC; Future -     COMPLETE METABOLIC PANEL WITH GFR; Future -     Vitamin D, 25-hydroxy; Future -     MM Digital Screening; Future -     Cytology - PAP [place] -     Cervicovaginal ancillary only  Interpreter was used to  communicate directly with patient for the entire encounter including providing detailed patient instructions.   Return if symptoms worsen or fail to improve, for with [place].        Holland CommonsKECK, Andrei Mccook, NP-C [facility] [phone] 07/18/2014, 4:53 PM

## 2014-07-22 LAB — CERVICOVAGINAL ANCILLARY ONLY
CHLAMYDIA, DNA PROBE: NEGATIVE
NEISSERIA GONORRHEA: NEGATIVE
Wet Prep (BD Affirm): NEGATIVE
Wet Prep (BD Affirm): NEGATIVE
Wet Prep (BD Affirm): NEGATIVE

## 2014-07-23 ENCOUNTER — Other Ambulatory Visit: Payer: Self-pay

## 2014-07-23 LAB — CYTOLOGY - PAP

## 2014-07-24 ENCOUNTER — Ambulatory Visit: Payer: Self-pay | Attending: Internal Medicine

## 2014-07-24 DIAGNOSIS — Z0000 Encounter for general adult medical examination without abnormal findings: Secondary | ICD-10-CM

## 2014-07-24 LAB — COMPLETE METABOLIC PANEL WITH GFR
ALBUMIN: 4.5 g/dL (ref 3.5–5.2)
ALT: 17 U/L (ref 0–35)
AST: 18 U/L (ref 0–37)
Alkaline Phosphatase: 45 U/L (ref 39–117)
BILIRUBIN TOTAL: 0.5 mg/dL (ref 0.2–1.2)
BUN: 10 mg/dL (ref 6–23)
CO2: 24 meq/L (ref 19–32)
Calcium: 9.5 mg/dL (ref 8.4–10.5)
Chloride: 101 mEq/L (ref 96–112)
Creat: 0.57 mg/dL (ref 0.50–1.10)
Glucose, Bld: 103 mg/dL — ABNORMAL HIGH (ref 70–99)
Potassium: 4.3 mEq/L (ref 3.5–5.3)
SODIUM: 139 meq/L (ref 135–145)
TOTAL PROTEIN: 7.6 g/dL (ref 6.0–8.3)

## 2014-07-24 LAB — CBC
HCT: 40.1 % (ref 36.0–46.0)
Hemoglobin: 13.6 g/dL (ref 12.0–15.0)
MCH: 30.3 pg (ref 26.0–34.0)
MCHC: 33.9 g/dL (ref 30.0–36.0)
MCV: 89.3 fL (ref 78.0–100.0)
MPV: 9.5 fL (ref 8.6–12.4)
Platelets: 269 10*3/uL (ref 150–400)
RBC: 4.49 MIL/uL (ref 3.87–5.11)
RDW: 12.9 % (ref 11.5–15.5)
WBC: 4.9 10*3/uL (ref 4.0–10.5)

## 2014-07-24 LAB — LIPID PANEL
Cholesterol: 250 mg/dL — ABNORMAL HIGH (ref 0–200)
HDL: 45 mg/dL (ref >39)
LDL CALC: 151 mg/dL — AB (ref 0–99)
TRIGLYCERIDES: 271 mg/dL — AB (ref <150)
Total CHOL/HDL Ratio: 5.6 Ratio
VLDL: 54 mg/dL — AB (ref 0–40)

## 2014-07-25 LAB — VITAMIN D 25 HYDROXY (VIT D DEFICIENCY, FRACTURES): Vit D, 25-Hydroxy: 33 ng/mL (ref 30–100)

## 2014-07-26 ENCOUNTER — Telehealth: Payer: Self-pay | Admitting: *Deleted

## 2014-07-26 NOTE — Telephone Encounter (Signed)
-----   Message from Ambrose FinlandValerie A Keck, NP sent at 07/23/2014 12:26 AM EST ----- Negative for infections. Still waiting on cytology

## 2014-07-26 NOTE — Telephone Encounter (Signed)
Unable to contact,  Pt No answer

## 2014-07-30 ENCOUNTER — Telehealth: Payer: Self-pay | Admitting: *Deleted

## 2014-07-30 NOTE — Telephone Encounter (Signed)
-----   Message from Ambrose FinlandValerie A Keck, NP sent at 07/26/2014 11:08 PM EST ----- Patient pap is negative for malignancies and infections. Will repeat in 3 years.

## 2014-07-30 NOTE — Telephone Encounter (Signed)
I used the interpreter line to let the pt know her lab results. Pt just picked up the simvastatin. I told her to start taking the rx everyday and to start cutting out the carbs.

## 2014-07-30 NOTE — Telephone Encounter (Signed)
Used [employer] [place] [phone] Pt aware of pap smear result

## 2014-07-30 NOTE — Telephone Encounter (Signed)
-----   Message from Ambrose FinlandValerie A Keck, NP sent at 07/26/2014 10:56 PM EST ----- Is patient taking simvastatin daily. Please explain to patient to cut back on carbs/rice. If she is taking medication daily please switch her to Crestor 10 mg.

## 2015-01-21 ENCOUNTER — Ambulatory Visit: Payer: Self-pay | Attending: Internal Medicine

## 2015-01-22 ENCOUNTER — Telehealth: Payer: Self-pay

## 2015-01-22 DIAGNOSIS — I10 Essential (primary) hypertension: Secondary | ICD-10-CM

## 2015-01-22 MED ORDER — SIMVASTATIN 40 MG PO TABS: 40.0000 mg | ORAL_TABLET | Freq: Every day | ORAL | Status: DC

## 2015-01-22 MED ORDER — LISINOPRIL 40 MG PO TABS: 40.0000 mg | ORAL_TABLET | Freq: Every day | ORAL | Status: DC

## 2015-01-22 NOTE — Telephone Encounter (Signed)
Pt called in requesting refills on Simvastin, Lisinipril. Prescription sent to Pharmacy on file.

## 2015-06-03 ENCOUNTER — Other Ambulatory Visit: Payer: Self-pay | Admitting: Internal Medicine

## 2015-07-24 ENCOUNTER — Telehealth: Payer: Self-pay | Admitting: Internal Medicine

## 2015-07-24 DIAGNOSIS — I10 Essential (primary) hypertension: Secondary | ICD-10-CM

## 2015-07-24 MED ORDER — LISINOPRIL 40 MG PO TABS: 40.0000 mg | ORAL_TABLET | Freq: Every day | ORAL | Status: DC

## 2015-07-24 NOTE — Telephone Encounter (Signed)
Patient came into office requesting medication refill on Lisinopril, please f/u

## 2015-07-24 NOTE — Telephone Encounter (Signed)
Interpreter line used Thi ID# [id number] Returned call to patient  Patient not available Unable to leave message Number given is not accepting incoming calls A thirty day supply of lisinopril sent to the pharmacy Patient will need an appointment for future refills Patient has not been seen in the office in almost a year

## 2015-07-29 ENCOUNTER — Ambulatory Visit: Payer: Self-pay | Attending: Internal Medicine

## 2015-07-30 ENCOUNTER — Other Ambulatory Visit: Payer: Self-pay | Admitting: Internal Medicine

## 2015-08-05 ENCOUNTER — Ambulatory Visit: Payer: Self-pay | Attending: Internal Medicine | Admitting: Internal Medicine

## 2015-08-05 ENCOUNTER — Encounter: Payer: Self-pay | Admitting: Internal Medicine

## 2015-08-05 VITALS — BP 134/85 | HR 79 | Temp 98.0°F | Resp 16 | Ht 61.0 in | Wt 116.4 lb

## 2015-08-05 DIAGNOSIS — Z79899 Other long term (current) drug therapy: Secondary | ICD-10-CM | POA: Insufficient documentation

## 2015-08-05 DIAGNOSIS — R252 Cramp and spasm: Secondary | ICD-10-CM

## 2015-08-05 DIAGNOSIS — I10 Essential (primary) hypertension: Secondary | ICD-10-CM

## 2015-08-05 DIAGNOSIS — E785 Hyperlipidemia, unspecified: Secondary | ICD-10-CM

## 2015-08-05 DIAGNOSIS — Z0000 Encounter for general adult medical examination without abnormal findings: Secondary | ICD-10-CM

## 2015-08-05 MED ORDER — LISINOPRIL 40 MG PO TABS: 40.0000 mg | ORAL_TABLET | Freq: Every day | ORAL | Status: DC

## 2015-08-05 MED ORDER — SIMVASTATIN 40 MG PO TABS: 40.0000 mg | ORAL_TABLET | Freq: Every day | ORAL | Status: DC

## 2015-08-05 NOTE — Progress Notes (Signed)
Patient ID: Amber Grant [place], female   DOB: 1960-08-13, 55 y.o.   MRN: [id number] Subjective:  Amber Grant Trent is a 55 y.o. female with hypertension and HLD.  Patient reports that she would like a one year supply of her medications because she has to rely on others for transportation. She complains of slight muscle cramps at bedtime.  Patient reports that she had symptoms of cough and nasal congestion 10 days ago but now feels much better.   Current Outpatient Prescriptions  Medication Sig Dispense Refill  . lisinopril (PRINIVIL,ZESTRIL) 40 MG tablet Take 1 tablet (40 mg total) by mouth daily. 30 tablet 0  . simvastatin (ZOCOR) 40 MG tablet Take 1 tablet (40 mg total) by mouth at bedtime. For cholesterol 90 tablet 3  . cholecalciferol (VITAMIN D) 1000 UNITS tablet Take 1,000 Units by mouth daily.    . fish oil-omega-3 fatty acids 1000 MG capsule Take 2 capsules (2 g total) by mouth daily. (Patient not taking: Reported on 07/18/2014) 90 capsule 3  . Multiple Vitamins-Minerals (MULTIVITAMIN PO) Take by mouth.     No current facility-administered medications for this visit.    ROS: taking medications as instructed, no medication side effects noted, no TIA's, no chest pain on exertion, no dyspnea on exertion, no swelling of ankles, no palpitations and no intermittent claudication symptoms. All other systems negative other than what is stated.   Objective:  BP 134/85 mmHg  Pulse 79  Temp(Src) 98 F (36.7 C)  Resp 16  Ht  (1.549 m)  Wt 116 lb 6.4 oz (52.799 kg)  BMI 22.01 kg/m2  SpO2 100%  Appearance alert, well appearing, and in no distress, oriented to person, place, and time and normal appearing weight. General exam BP noted to be well controlled today in office, S1, S2 normal, no gallop, no murmur, chest clear, no JVD, no HSM, no edema, no bruits.   Lab review: no lab studies available for review at time of visit.   Assessment:   Amber Grant was seen today for follow-up.  Diagnoses and all  orders for this visit:  Essential hypertension, benign -     lisinopril (PRINIVIL,ZESTRIL) 40 MG tablet; Take 1 tablet (40 mg total) by mouth daily. -     Basic Metabolic Panel; Future Patient blood pressure is stable and may continue on current medication.  Education on diet, exercise, and modifiable risk factors discussed. Will obtain appropriate labs as needed. Will follow up in 3-6 months.   HLD (hyperlipidemia) -     simvastatin (ZOCOR) 40 MG tablet; Take 1 tablet (40 mg total) by mouth at bedtime. For cholesterol -     Lipid panel; Future Education provided on proper lifestyle changes in order to lower cholesterol. Patient advised to maintain healthy weight and to keep total fat intake at 25-35% of total calories and carbohydrates 50-60% of total daily calories. Explained how high cholesterol places patient at risk for heart disease. Patient placed on appropriate medication and repeat labs in 6 months   Muscle cramps I will check a potassium level to make sure that is not causing muscle cramps. I have explained the importance of having regular lab work with the high dose lisinopril. I have only given her 6 months of medication to make sure she returns for lab visit.   Plan:  Current treatment plan is effective, no change in therapy. Reviewed diet, exercise and weight control. Recommended sodium restriction.   Due to language barrier, an interpreter was present  during the history-taking and subsequent discussion (and for part of the physical exam) with this patient.  Return in about 1 day (around 08/06/2015) for Lab Visit and 6 mo PCP HTN/HLD.  Ambrose Finland, NP 08/05/2015 2:37 PM

## 2015-08-05 NOTE — Progress Notes (Signed)
Patient here for follow up on her HTN and cholesterol And for medication refills Patient would like the flu shot but she is a little under the weather

## 2015-08-06 ENCOUNTER — Ambulatory Visit: Payer: Self-pay | Attending: Internal Medicine

## 2015-08-06 DIAGNOSIS — I10 Essential (primary) hypertension: Secondary | ICD-10-CM | POA: Insufficient documentation

## 2015-08-06 DIAGNOSIS — E785 Hyperlipidemia, unspecified: Secondary | ICD-10-CM

## 2015-08-06 LAB — LIPID PANEL
CHOL/HDL RATIO: 2.6 ratio (ref <=5.0)
Cholesterol: 141 mg/dL (ref 125–200)
HDL: 54 mg/dL (ref >=46)
LDL CALC: 69 mg/dL (ref <130)
Triglycerides: 88 mg/dL (ref <150)
VLDL: 18 mg/dL (ref <30)

## 2015-08-06 LAB — BASIC METABOLIC PANEL
BUN: 10 mg/dL (ref 7–25)
CALCIUM: 9.8 mg/dL (ref 8.6–10.4)
CO2: 29 mmol/L (ref 20–31)
Chloride: 103 mmol/L (ref 98–110)
Creat: 0.67 mg/dL (ref 0.50–1.05)
Glucose, Bld: 116 mg/dL — ABNORMAL HIGH (ref 65–99)
Potassium: 5.4 mmol/L — ABNORMAL HIGH (ref 3.5–5.3)
SODIUM: 141 mmol/L (ref 135–146)

## 2015-08-06 NOTE — Patient Instructions (Signed)
Patient is aware of receiving a FU call for results.

## 2015-08-10 ENCOUNTER — Other Ambulatory Visit: Payer: Self-pay | Admitting: Internal Medicine

## 2015-08-10 DIAGNOSIS — E875 Hyperkalemia: Secondary | ICD-10-CM

## 2015-08-10 MED ORDER — FUROSEMIDE 20 MG PO TABS: 20.0000 mg | ORAL_TABLET | Freq: Every day | ORAL | Status: DC

## 2015-08-11 ENCOUNTER — Telehealth: Payer: Self-pay

## 2015-08-11 MED ORDER — FUROSEMIDE 20 MG PO TABS: 20.0000 mg | ORAL_TABLET | Freq: Every day | ORAL | Status: DC

## 2015-08-11 NOTE — Telephone Encounter (Signed)
Interpreter line used [place] Bucker ID# [phone] Spoke with patient's husband and he will make his wife aware of her Lab results Patient [facility]

## 2015-08-11 NOTE — Telephone Encounter (Signed)
-----   Message from Ambrose Finland, NP sent at 08/10/2015  5:08 PM EST ----- Cholesterol looks great. Potassium slightly elevated, may take Lasix 20 mg daily for 5 days to lower level---will make her urinate out potassium. Will recheck at next follow up.

## 2015-10-20 ENCOUNTER — Other Ambulatory Visit: Payer: Self-pay | Admitting: Internal Medicine

## 2015-10-22 ENCOUNTER — Other Ambulatory Visit: Payer: Self-pay | Admitting: Pharmacist

## 2015-10-22 ENCOUNTER — Telehealth: Payer: Self-pay | Admitting: Internal Medicine

## 2015-10-22 DIAGNOSIS — I10 Essential (primary) hypertension: Secondary | ICD-10-CM

## 2015-10-22 MED ORDER — LISINOPRIL 40 MG PO TABS: 40.0000 mg | ORAL_TABLET | Freq: Every day | ORAL | Status: DC

## 2015-10-22 NOTE — Telephone Encounter (Signed)
Refill sent to [facility] on [address]  

## 2015-10-22 NOTE — Telephone Encounter (Signed)
Patient needs refill for lisinopril. [facility] [address] Please follow up.

## 2016-01-20 ENCOUNTER — Ambulatory Visit: Payer: Self-pay

## 2016-01-21 ENCOUNTER — Ambulatory Visit: Payer: Self-pay | Attending: Internal Medicine

## 2016-01-29 ENCOUNTER — Ambulatory Visit: Payer: Self-pay | Attending: Family Medicine | Admitting: Family Medicine

## 2016-01-29 ENCOUNTER — Encounter: Payer: Self-pay | Admitting: Family Medicine

## 2016-01-29 VITALS — BP 114/75 | HR 87 | Temp 98.6°F | Ht 60.5 in | Wt 112.8 lb

## 2016-01-29 DIAGNOSIS — E785 Hyperlipidemia, unspecified: Secondary | ICD-10-CM | POA: Insufficient documentation

## 2016-01-29 DIAGNOSIS — I10 Essential (primary) hypertension: Secondary | ICD-10-CM | POA: Insufficient documentation

## 2016-01-29 DIAGNOSIS — Z79899 Other long term (current) drug therapy: Secondary | ICD-10-CM | POA: Insufficient documentation

## 2016-01-29 DIAGNOSIS — Z1159 Encounter for screening for other viral diseases: Secondary | ICD-10-CM

## 2016-01-29 LAB — COMPLETE METABOLIC PANEL WITH GFR
ALT: 39 U/L — AB (ref 6–29)
AST: 24 U/L (ref 10–35)
Albumin: 4.6 g/dL (ref 3.6–5.1)
Alkaline Phosphatase: 39 U/L (ref 33–130)
BILIRUBIN TOTAL: 0.9 mg/dL (ref 0.2–1.2)
BUN: 13 mg/dL (ref 7–25)
CO2: 24 mmol/L (ref 20–31)
CREATININE: 0.68 mg/dL (ref 0.50–1.05)
Calcium: 9.6 mg/dL (ref 8.6–10.4)
Chloride: 102 mmol/L (ref 98–110)
GFR, Est African American: >89 mL/min (ref >=60)
GFR, Est Non African American: >89 mL/min (ref >=60)
GLUCOSE: 110 mg/dL — AB (ref 65–99)
Potassium: 4.7 mmol/L (ref 3.5–5.3)
Sodium: 138 mmol/L (ref 135–146)
TOTAL PROTEIN: 7.7 g/dL (ref 6.1–8.1)

## 2016-01-29 MED ORDER — SIMVASTATIN 40 MG PO TABS: 40.0000 mg | ORAL_TABLET | Freq: Every day | ORAL | 1 refills | Status: DC

## 2016-01-29 MED ORDER — LISINOPRIL 40 MG PO TABS: 40.0000 mg | ORAL_TABLET | Freq: Every day | ORAL | 1 refills | Status: DC

## 2016-01-29 NOTE — Progress Notes (Signed)
Medication refill

## 2016-01-29 NOTE — Progress Notes (Signed)
Subjective:  Patient ID: Amber Grant, female    DOB: [birth year]  Age: 55 y.o. MRN: [id number]  CC: Hypertension and Hyperlipidemia   HPI Amber Grant is a 55 year old female with a history of hypertension, hyperlipidemia who comes into the clinic for a follow-up visit. She has been compliant with her antihypertensive and statin as well as a low sodium and low-cholesterol diet. She denies chest pain, shortness of breath, abdominal pain or fever.  Requesting refills of her medications and has no complaints today  Past Medical History:  Diagnosis Date  . Hypertension     History reviewed. No pertinent surgical history.  No Known Allergies  Outpatient Medications Prior to Visit  Medication Sig Dispense Refill  . fish oil-omega-3 fatty acids 1000 MG capsule Take 2 capsules (2 g total) by mouth daily. 90 capsule 3  . Multiple Vitamins-Minerals (MULTIVITAMIN PO) Take by mouth.    . cholecalciferol (VITAMIN D) 1000 UNITS tablet Take 1,000 Units by mouth daily.    . furosemide (LASIX) 20 MG tablet Take 1 tablet (20 mg total) by mouth daily. (Patient not taking: Reported on 01/29/2016) 5 tablet 0  . furosemide (LASIX) 20 MG tablet Take 1 tablet (20 mg total) by mouth daily. 5 tablet 0  . lisinopril (PRINIVIL,ZESTRIL) 40 MG tablet Take 1 tablet (40 mg total) by mouth daily. 90 tablet 0  . simvastatin (ZOCOR) 40 MG tablet Take 1 tablet (40 mg total) by mouth at bedtime. For cholesterol 90 tablet 1   No facility-administered medications prior to visit.     ROS Review of Systems  Constitutional: Negative for activity change, appetite change and fatigue.  HENT: Negative for congestion, sinus pressure and sore throat.   Eyes: Negative for visual disturbance.  Respiratory: Negative for cough, chest tightness, shortness of breath and wheezing.   Cardiovascular: Negative for chest pain and palpitations.  Gastrointestinal: Negative for abdominal distention, abdominal pain and constipation.    Endocrine: Negative for polydipsia.  Genitourinary: Negative for dysuria and frequency.  Musculoskeletal: Negative for arthralgias and back pain.  Skin: Negative for rash.  Neurological: Negative for tremors, light-headedness and numbness.  Hematological: Does not bruise/bleed easily.  Psychiatric/Behavioral: Negative for agitation and behavioral problems.    Objective:  BP 114/75 (BP Location: Right Arm, Patient Position: Sitting, Cuff Size: Small)   Pulse 87   Temp 98.6 F (37 C) (Oral)   Ht 5' 0.5" (1.537 m)   Wt 112 lb 12.8 oz (51.2 kg)   SpO2 (!) 87%   BMI 21.67 kg/m   BP/Weight 01/29/2016 08/05/2015 07/18/2014  Systolic BP 114 134 146  Diastolic BP 75 85 89  Wt. (Lbs) 112.8 116.4 118  BMI 21.67 22.01 22.31      Physical Exam  Constitutional: She is oriented to person, place, and time. She appears well-developed and well-nourished.  Cardiovascular: Normal rate, normal heart sounds and intact distal pulses.   No murmur heard. Pulmonary/Chest: Effort normal and breath sounds normal. She has no wheezes. She has no rales. She exhibits no tenderness.  Abdominal: Soft. Bowel sounds are normal. She exhibits no distension and no mass. There is no tenderness.  Musculoskeletal: Normal range of motion.  Neurological: She is alert and oriented to person, place, and time.   Lipid Panel     Component Value Date/Time   CHOL 141 08/06/2015 1009   TRIG 88 08/06/2015 1009   HDL 54 08/06/2015 1009   CHOLHDL 2.6 08/06/2015 1009   VLDL 18 08/06/2015 1009  LDLCALC 69 08/06/2015 1009     Assessment & Plan:   1. HLD (hyperlipidemia) Controlled - simvastatin (ZOCOR) 40 MG tablet; Take 1 tablet (40 mg total) by mouth at bedtime. For cholesterol  Dispense: 90 tablet; Refill: 1  2. Essential hypertension, benign Controlled - lisinopril (PRINIVIL,ZESTRIL) 40 MG tablet; Take 1 tablet (40 mg total) by mouth daily.  Dispense: 90 tablet; Refill: 1 - COMPLETE METABOLIC PANEL WITH  GFR  Refuses screening for hepatitis C and HIV due to the fact that she is afraid she would incur a bill as she did so the last time she had labs  Meds ordered this encounter  Medications  . simvastatin (ZOCOR) 40 MG tablet    Sig: Take 1 tablet (40 mg total) by mouth at bedtime. For cholesterol    Dispense:  90 tablet    Refill:  1  . lisinopril (PRINIVIL,ZESTRIL) 40 MG tablet    Sig: Take 1 tablet (40 mg total) by mouth daily.    Dispense:  90 tablet    Refill:  1    Follow-up: Return in about 2 weeks (around 02/12/2016) for complete physical.   Jaclyn ShaggyEnobong Amao MD

## 2016-02-04 ENCOUNTER — Telehealth: Payer: Self-pay

## 2016-02-04 NOTE — Telephone Encounter (Signed)
-----   Message from Jaclyn ShaggyEnobong Amao, MD sent at 01/30/2016  8:18 AM EDT ----- Please inform the patient that labs are normal. Thank you.

## 2016-02-04 NOTE — Telephone Encounter (Signed)
Writer called patient through [employer] and LVM regarding wanting to speak with her in regards to her lab results.

## 2016-02-10 NOTE — Progress Notes (Signed)
Call placed to [employer] at [phone], Interpreter ID [id number] Unsuccessful call attempt to patient- at [phone] received message states person you have call is not accepting calls at this time. Call placed to patient [phone] RN advised patient that labs are normal. Patient verbalized understanding.

## 2016-02-19 ENCOUNTER — Encounter: Payer: Self-pay | Admitting: Family Medicine

## 2016-02-19 ENCOUNTER — Ambulatory Visit: Payer: Self-pay | Attending: Family Medicine | Admitting: Family Medicine

## 2016-02-19 VITALS — BP 144/82 | HR 119 | Temp 102.8°F | Ht 60.0 in | Wt 114.8 lb

## 2016-02-19 DIAGNOSIS — B349 Viral infection, unspecified: Secondary | ICD-10-CM | POA: Insufficient documentation

## 2016-02-19 DIAGNOSIS — I10 Essential (primary) hypertension: Secondary | ICD-10-CM | POA: Insufficient documentation

## 2016-02-19 LAB — INFLUENZA A AND B AG, IMMUNOASSAY
INFLUENZA A ANTIGEN: DETECTED — AB
INFLUENZA B ANTIGEN: NOT DETECTED

## 2016-02-19 MED ORDER — OSELTAMIVIR PHOSPHATE 75 MG PO CAPS: 75.0000 mg | ORAL_CAPSULE | Freq: Two times a day (BID) | ORAL | 0 refills | Status: DC

## 2016-02-19 NOTE — Progress Notes (Signed)
Flu symptoms

## 2016-02-19 NOTE — Patient Instructions (Signed)
Influenza Tests WHY AM I HAVING THIS TEST? You may have an influenza test to help your health care provider determine what type of respiratory infection you have. The test may also be used to help determine a treatment plan and to monitor influenza activity within a community. There are two types of influenza virus: types A and B. Often, one strain of type A influenza will be the most common type of influenza in a community during flu season. This is typically between the months of October and May. Influenza tests can help determine which strain of influenza type A is occurring most often in the community. WHAT KIND OF SAMPLE IS TAKEN? Influenza tests are performed by collecting a small sample of fluids (secretions) from your nose or throat using a cotton swab. Tests performed on nasal secretions are more accurate than tests performed on a sample taken from your throat.  Rapid influenza tests are available and have become the most frequently used tests for influenza. They are most accurate when completed within the first 48 hours after your symptoms begin.  Depending on the method, a rapid influenza test may be completed in your health care provider's office in less than 30 minutes. It can also be sent to a lab with the results available the same day.  Depending on the particular type of test used, it can identify influenza type A, a mixture of types A and B, or differentiate between type A and B.  Another test that your health care provider may order is a viral culture. This also requires the collection of secretions from your nose or throat. The sample is then sent to a lab for processing. This may take several days to complete. HOW ARE YOUR TEST RESULTS REPORTED? Your test results will be reported as either positive or negative. A false-negative result can occur. A false-negative result is incorrect because it indicates a condition or finding is not present when it is. It is your responsibility to  obtain your test results. Ask the lab or department performing the test when and how you will get your results. WHAT DO THE RESULTS MEAN?  A positive test means you have influenza. Tests may further determine the type of influenza you have.  A negative influenza test result means it is not likely that you have influenza.  A false-negative result can occur. False-negative results are more likely to happen at the height of the influenza season. Talk with your health care provider to discuss your results, treatment options, and if necessary, the need for more tests. Talk with your health care provider if you have any questions about your results.   This information is not intended to replace advice given to you by your health care provider. Make sure you discuss any questions you have with your health care provider.   Document Released: 03/10/2005 Document Revised: 06/21/2014 Document Reviewed: 10/17/2013 Elsevier Interactive Patient Education 2016 [employer]  

## 2016-02-19 NOTE — Progress Notes (Signed)
Subjective:  Patient ID: Amber Grant, female    DOB: 12/28/1960  Age: 55 y.o. MRN: [id number]  CC: Fever; Generalized Body Aches; Cough; and Nausea   HPI Amber Grant is a 55 year old female with a history of hypertension, hyperlipidemia presents with a one-day history of fatigue, fever, myalgias, nausea, decreased appetite, cough productive of brownish sputum. She has not used any OTC medications and denies history of sick contacts. Denies chest pain, wheezing, sore throat, sinus tenderness but does have some postnasal drip.  Past Medical History:  Diagnosis Date  . Hypertension     History reviewed. No pertinent surgical history.   Outpatient Medications Prior to Visit  Medication Sig Dispense Refill  . cholecalciferol (VITAMIN D) 1000 UNITS tablet Take 1,000 Units by mouth daily.    . fish oil-omega-3 fatty acids 1000 MG capsule Take 2 capsules (2 g total) by mouth daily. 90 capsule 3  . lisinopril (PRINIVIL,ZESTRIL) 40 MG tablet Take 1 tablet (40 mg total) by mouth daily. 90 tablet 1  . Multiple Vitamins-Minerals (MULTIVITAMIN PO) Take by mouth.    . simvastatin (ZOCOR) 40 MG tablet Take 1 tablet (40 mg total) by mouth at bedtime. For cholesterol 90 tablet 1   No facility-administered medications prior to visit.     ROS Review of Systems  Constitutional: Positive for appetite change, fatigue and fever. Negative for activity change.  HENT: Negative for congestion, sinus pressure and sore throat.   Eyes: Negative for visual disturbance.  Respiratory: Positive for cough. Negative for chest tightness, shortness of breath and wheezing.   Cardiovascular: Negative for chest pain and palpitations.  Gastrointestinal: Negative for abdominal distention, abdominal pain and constipation.  Endocrine: Negative for polydipsia.  Genitourinary: Negative for dysuria and frequency.  Musculoskeletal: Positive for myalgias. Negative for arthralgias and back pain.  Skin: Negative for rash.    Neurological: Negative for tremors, light-headedness and numbness.  Hematological: Does not bruise/bleed easily.  Psychiatric/Behavioral: Negative for agitation and behavioral problems.    Objective:  BP (!) 144/82 (BP Location: Right Arm, Patient Position: Sitting, Cuff Size: Small)   Pulse (!) 119   Temp (!) 102.8 F (39.3 C) (Oral)   Ht 5' (1.524 m)   Wt 114 lb 12.8 oz (52.1 kg)   SpO2 98%   BMI 22.42 kg/m   BP/Weight 02/19/2016 01/29/2016 08/05/2015  Systolic BP 144 114 134  Diastolic BP 82 75 85  Wt. (Lbs) 114.8 112.8 116.4  BMI 22.42 21.67 22.01      Physical Exam  Constitutional: She is oriented to person, place, and time.  Acutely ill looking  Cardiovascular: Normal heart sounds and intact distal pulses.  Tachycardia present.   No murmur heard. Pulmonary/Chest: Effort normal and breath sounds normal. She has no wheezes. She has no rales. She exhibits no tenderness.  Abdominal: Soft. Bowel sounds are normal. She exhibits no distension and no mass. There is no tenderness.  Musculoskeletal: Normal range of motion.  Neurological: She is alert and oriented to person, place, and time.     Assessment & Plan:   1. Viral illness Will treat presumptively for the flu - Influenza A and B Ag, Immunoassay - oseltamivir (TAMIFLU) 75 MG capsule; Take 1 capsule (75 mg total) by mouth 2 (two) times daily.  Dispense: 10 capsule; Refill: 0   Meds ordered this encounter  Medications  . oseltamivir (TAMIFLU) 75 MG capsule    Sig: Take 1 capsule (75 mg total) by mouth 2 (two) times daily.  Dispense:  10 capsule    Refill:  0    Follow-up: Return in about 3 weeks (around 03/11/2016), or if symptoms worsen or fail to improve, for complete physical.   Jaclyn ShaggyEnobong Amao MD

## 2016-02-25 ENCOUNTER — Telehealth: Payer: Self-pay

## 2016-02-25 NOTE — Telephone Encounter (Signed)
-----   Message from Jaclyn ShaggyEnobong Amao, MD sent at 02/20/2016  2:01 PM EDT ----- She tested positive for Influenza A. Advised to complete the course of Tamiflu. Her close contacts also need to be treated.

## 2016-02-25 NOTE — Telephone Encounter (Signed)
Writer spoke with patient's daughter in [employer] through [employer].  She lives with patient and understands that Dr. Venetia NightAmao is recommending that everyone that has had close contact with patient should be treated.  She will pass the message on to patient and states that patient took her last dose of Tamiflu yesterday.

## 2016-03-12 ENCOUNTER — Ambulatory Visit: Payer: Self-pay | Attending: Family Medicine | Admitting: Family Medicine

## 2016-03-12 ENCOUNTER — Encounter: Payer: Self-pay | Admitting: Family Medicine

## 2016-03-12 DIAGNOSIS — Z124 Encounter for screening for malignant neoplasm of cervix: Secondary | ICD-10-CM

## 2016-03-12 DIAGNOSIS — Z79899 Other long term (current) drug therapy: Secondary | ICD-10-CM | POA: Insufficient documentation

## 2016-03-12 DIAGNOSIS — Z0000 Encounter for general adult medical examination without abnormal findings: Secondary | ICD-10-CM | POA: Insufficient documentation

## 2016-03-12 DIAGNOSIS — Z23 Encounter for immunization: Secondary | ICD-10-CM

## 2016-03-12 DIAGNOSIS — Z1239 Encounter for other screening for malignant neoplasm of breast: Secondary | ICD-10-CM

## 2016-03-12 DIAGNOSIS — Z1211 Encounter for screening for malignant neoplasm of colon: Secondary | ICD-10-CM

## 2016-03-12 DIAGNOSIS — I10 Essential (primary) hypertension: Secondary | ICD-10-CM | POA: Insufficient documentation

## 2016-03-12 DIAGNOSIS — E785 Hyperlipidemia, unspecified: Secondary | ICD-10-CM | POA: Insufficient documentation

## 2016-03-12 MED ORDER — LISINOPRIL 40 MG PO TABS: 40.0000 mg | ORAL_TABLET | Freq: Every day | ORAL | 1 refills | Status: DC

## 2016-03-12 MED ORDER — SIMVASTATIN 40 MG PO TABS: 40.0000 mg | ORAL_TABLET | Freq: Every day | ORAL | 1 refills | Status: DC

## 2016-03-12 NOTE — Patient Instructions (Signed)
Health Maintenance, Female Adopting a healthy lifestyle and getting preventive care can go a long way to promote health and wellness. Talk with your health care provider about what schedule of regular examinations is right for you. This is a good chance for you to check in with your provider about disease prevention and staying healthy. In between checkups, there are plenty of things you can do on your own. Experts have done a lot of research about which lifestyle changes and preventive measures are most likely to keep you healthy. Ask your health care provider for more information. WEIGHT AND DIET  Eat a healthy diet  Be sure to include plenty of vegetables, fruits, low-fat dairy products, and lean protein.  Do not eat a lot of foods high in solid fats, added sugars, or salt.  Get regular exercise. This is one of the most important things you can do for your health.  Most adults should exercise for at least 150 minutes each week. The exercise should increase your heart rate and make you sweat (moderate-intensity exercise).  Most adults should also do strengthening exercises at least twice a week. This is in addition to the moderate-intensity exercise.  Maintain a healthy weight  Body mass index (BMI) is a measurement that can be used to identify possible weight problems. It estimates body fat based on height and weight. Your health care provider can help determine your BMI and help you achieve or maintain a healthy weight.  For females 9 years of age and older:   A BMI below 18.5 is considered underweight.  A BMI of 18.5 to 24.9 is normal.  A BMI of 25 to 29.9 is considered overweight.  A BMI of 30 and above is considered obese.  Watch levels of cholesterol and blood lipids  You should start having your blood tested for lipids and cholesterol at 53 years of age, then have this test every 5 years.  You may need to have your cholesterol levels checked more often if:  Your lipid  or cholesterol levels are high.  You are older than 53 years of age.  You are at high risk for heart disease.  CANCER SCREENING   Lung Cancer  Lung cancer screening is recommended for adults 59-78 years old who are at high risk for lung cancer because of a history of smoking.  A yearly low-dose CT scan of the lungs is recommended for people who:  Currently smoke.  Have quit within the past 15 years.  Have at least a 30-pack-year history of smoking. A pack year is smoking an average of one pack of cigarettes a day for 1 year.  Yearly screening should continue until it has been 15 years since you quit.  Yearly screening should stop if you develop a health problem that would prevent you from having lung cancer treatment.  Breast Cancer  Practice breast self-awareness. This means understanding how your breasts normally appear and feel.  It also means doing regular breast self-exams. Let your health care provider know about any changes, no matter how small.  If you are in your 20s or 30s, you should have a clinical breast exam (CBE) by a health care provider every 1-3 years as part of a regular health exam.  If you are 64 or older, have a CBE every year. Also consider having a breast X-ray (mammogram) every year.  If you have a family history of breast cancer, talk to your health care provider about genetic screening.  If you  are at high risk for breast cancer, talk to your health care provider about having an MRI and a mammogram every year.  Breast cancer gene (BRCA) assessment is recommended for women who have family members with BRCA-related cancers. BRCA-related cancers include:  Breast.  Ovarian.  Tubal.  Peritoneal cancers.  Results of the assessment will determine the need for genetic counseling and BRCA1 and BRCA2 testing. Cervical Cancer Your health care provider may recommend that you be screened regularly for cancer of the pelvic organs (ovaries, uterus, and  vagina). This screening involves a pelvic examination, including checking for microscopic changes to the surface of your cervix (Pap test). You may be encouraged to have this screening done every 3 years, beginning at age 21.  For women ages 30-65, health care providers may recommend pelvic exams and Pap testing every 3 years, or they may recommend the Pap and pelvic exam, combined with testing for human papilloma virus (HPV), every 5 years. Some types of HPV increase your risk of cervical cancer. Testing for HPV may also be done on women of any age with unclear Pap test results.  Other health care providers may not recommend any screening for nonpregnant women who are considered low risk for pelvic cancer and who do not have symptoms. Ask your health care provider if a screening pelvic exam is right for you.  If you have had past treatment for cervical cancer or a condition that could lead to cancer, you need Pap tests and screening for cancer for at least 20 years after your treatment. If Pap tests have been discontinued, your risk factors (such as having a new sexual partner) need to be reassessed to determine if screening should resume. Some women have medical problems that increase the chance of getting cervical cancer. In these cases, your health care provider may recommend more frequent screening and Pap tests. Colorectal Cancer  This type of cancer can be detected and often prevented.  Routine colorectal cancer screening usually begins at 53 years of age and continues through 53 years of age.  Your health care provider may recommend screening at an earlier age if you have risk factors for colon cancer.  Your health care provider may also recommend using home test kits to check for hidden blood in the stool.  A small camera at the end of a tube can be used to examine your colon directly (sigmoidoscopy or colonoscopy). This is done to check for the earliest forms of colorectal  cancer.  Routine screening usually begins at age 50.  Direct examination of the colon should be repeated every 5-10 years through 53 years of age. However, you may need to be screened more often if early forms of precancerous polyps or small growths are found. Skin Cancer  Check your skin from head to toe regularly.  Tell your health care provider about any new moles or changes in moles, especially if there is a change in a mole's shape or color.  Also tell your health care provider if you have a mole that is larger than the size of a pencil eraser.  Always use sunscreen. Apply sunscreen liberally and repeatedly throughout the day.  Protect yourself by wearing long sleeves, pants, a wide-brimmed hat, and sunglasses whenever you are outside. HEART DISEASE, DIABETES, AND HIGH BLOOD PRESSURE   High blood pressure causes heart disease and increases the risk of stroke. High blood pressure is more likely to develop in:  People who have blood pressure in the high end   of the normal range (130-139/85-89 mm Hg).  People who are overweight or obese.  People who are African American.  If you are 38-23 years of age, have your blood pressure checked every 3-5 years. If you are 61 years of age or older, have your blood pressure checked every year. You should have your blood pressure measured twice--once when you are at a hospital or clinic, and once when you are not at a hospital or clinic. Record the average of the two measurements. To check your blood pressure when you are not at a hospital or clinic, you can use:  An automated blood pressure machine at a pharmacy.  A home blood pressure monitor.  If you are between 45 years and 39 years old, ask your health care provider if you should take aspirin to prevent strokes.  Have regular diabetes screenings. This involves taking a blood sample to check your fasting blood sugar level.  If you are at a normal weight and have a low risk for diabetes,  have this test once every three years after 53 years of age.  If you are overweight and have a high risk for diabetes, consider being tested at a younger age or more often. PREVENTING INFECTION  Hepatitis B  If you have a higher risk for hepatitis B, you should be screened for this virus. You are considered at high risk for hepatitis B if:  You were born in a country where hepatitis B is common. Ask your health care provider which countries are considered high risk.  Your parents were born in a high-risk country, and you have not been immunized against hepatitis B (hepatitis B vaccine).  You have HIV or AIDS.  You use needles to inject street drugs.  You live with someone who has hepatitis B.  You have had sex with someone who has hepatitis B.  You get hemodialysis treatment.  You take certain medicines for conditions, including cancer, organ transplantation, and autoimmune conditions. Hepatitis C  Blood testing is recommended for:  Everyone born from [birth year] through 1963.  Anyone with known risk factors for hepatitis C. Sexually transmitted infections (STIs)  You should be screened for sexually transmitted infections (STIs) including gonorrhea and chlamydia if:  You are sexually active and are younger than 53 years of age.  You are older than 53 years of age and your health care provider tells you that you are at risk for this type of infection.  Your sexual activity has changed since you were last screened and you are at an increased risk for chlamydia or gonorrhea. Ask your health care provider if you are at risk.  If you do not have HIV, but are at risk, it may be recommended that you take a prescription medicine daily to prevent HIV infection. This is called pre-exposure prophylaxis (PrEP). You are considered at risk if:  You are sexually active and do not regularly use condoms or know the HIV status of your partner(s).  You take drugs by injection.  You are sexually  active with a partner who has HIV. Talk with your health care provider about whether you are at high risk of being infected with HIV. If you choose to begin PrEP, you should first be tested for HIV. You should then be tested every 3 months for as long as you are taking PrEP.  PREGNANCY   If you are premenopausal and you may become pregnant, ask your health care provider about preconception counseling.  If you may  become pregnant, take 400 to 800 micrograms (mcg) of folic acid every day.  If you want to prevent pregnancy, talk to your health care provider about birth control (contraception). OSTEOPOROSIS AND MENOPAUSE   Osteoporosis is a disease in which the bones lose minerals and strength with aging. This can result in serious bone fractures. Your risk for osteoporosis can be identified using a bone density scan.  If you are 61 years of age or older, or if you are at risk for osteoporosis and fractures, ask your health care provider if you should be screened.  Ask your health care provider whether you should take a calcium or vitamin D supplement to lower your risk for osteoporosis.  Menopause may have certain physical symptoms and risks.  Hormone replacement therapy may reduce some of these symptoms and risks. Talk to your health care provider about whether hormone replacement therapy is right for you.  HOME CARE INSTRUCTIONS   Schedule regular health, dental, and eye exams.  Stay current with your immunizations.   Do not use any tobacco products including cigarettes, chewing tobacco, or electronic cigarettes.  If you are pregnant, do not drink alcohol.  If you are breastfeeding, limit how much and how often you drink alcohol.  Limit alcohol intake to no more than 1 drink per day for nonpregnant women. One drink equals 12 ounces of beer, 5 ounces of wine, or 1 ounces of hard liquor.  Do not use street drugs.  Do not share needles.  Ask your health care provider for help if  you need support or information about quitting drugs.  Tell your health care provider if you often feel depressed.  Tell your health care provider if you have ever been abused or do not feel safe at home.   This information is not intended to replace advice given to you by your health care provider. Make sure you discuss any questions you have with your health care provider.   Document Released: 12/14/2010 Document Revised: 06/21/2014 Document Reviewed: 05/02/2013 Elsevier Interactive Patient Education [employer].

## 2016-03-12 NOTE — Progress Notes (Signed)
Subjective:  Patient ID: Amber Grant, female    DOB: [birth year]  Age: 55 y.o. MRN: [id number]  CC: Annual Exam   HPI Amber Grant presents for A complete physical exam. Completed Tamiflu 3 weeks ago as treatment for influenza A.  Past Medical History:  Diagnosis Date  . Hypertension     No past surgical history on file.   Outpatient Medications Prior to Visit  Medication Sig Dispense Refill  . cholecalciferol (VITAMIN D) 1000 UNITS tablet Take 1,000 Units by mouth daily.    . fish oil-omega-3 fatty acids 1000 MG capsule Take 2 capsules (2 g total) by mouth daily. 90 capsule 3  . Multiple Vitamins-Minerals (MULTIVITAMIN PO) Take by mouth.    Marland Kitchen. lisinopril (PRINIVIL,ZESTRIL) 40 MG tablet Take 1 tablet (40 mg total) by mouth daily. 90 tablet 1  . simvastatin (ZOCOR) 40 MG tablet Take 1 tablet (40 mg total) by mouth at bedtime. For cholesterol 90 tablet 1  . Chlorphen-Pseudoephed-APAP (FLU/COLD MEDICINE PO) Take by mouth.    . oseltamivir (TAMIFLU) 75 MG capsule Take 1 capsule (75 mg total) by mouth 2 (two) times daily. 10 capsule 0   No facility-administered medications prior to visit.     ROS Review of Systems  Constitutional: Negative for activity change, appetite change and fatigue.  HENT: Negative for congestion, sinus pressure and sore throat.   Eyes: Negative for visual disturbance.  Respiratory: Negative for cough, chest tightness, shortness of breath and wheezing.   Cardiovascular: Negative for chest pain and palpitations.  Gastrointestinal: Negative for abdominal distention, abdominal pain and constipation.  Endocrine: Negative for polydipsia.  Genitourinary: Negative for dysuria and frequency.  Musculoskeletal: Negative for arthralgias and back pain.  Skin: Negative for rash.  Neurological: Negative for tremors, light-headedness and numbness.  Hematological: Does not bruise/bleed easily.  Psychiatric/Behavioral: Negative for agitation and behavioral problems.     Objective:  BP 127/77 (BP Location: Right Arm, Patient Position: Sitting, Cuff Size: Normal)   Pulse 90   Temp 97.8 F (36.6 C) (Oral)   Resp 16   Ht 5\' 1"  (1.549 m)   Wt 114 lb (51.7 kg)   SpO2 97%   BMI 21.54 kg/m   BP/Weight 03/12/2016 02/19/2016 01/29/2016  Systolic BP 127 144 114  Diastolic BP 77 82 75  Wt. (Lbs) 114 114.8 112.8  BMI 21.54 22.42 21.67      Physical Exam  Constitutional: She is oriented to person, place, and time. She appears well-developed and well-nourished.  Cardiovascular: Normal rate, normal heart sounds and intact distal pulses.   No murmur heard. Pulmonary/Chest: Effort normal and breath sounds normal. She has no wheezes. She has no rales. She exhibits no tenderness. Right breast exhibits no mass and no tenderness. Left breast exhibits no mass and no tenderness.  Abdominal: Soft. Bowel sounds are normal. She exhibits no distension and no mass. There is no tenderness.  Genitourinary:  Genitourinary Comments: Normal external genitalia, normal vagina, normal cervix, no CMT  Musculoskeletal: Normal range of motion.  Neurological: She is alert and oriented to person, place, and time.     Assessment & Plan:   1. Essential hypertension, benign Controlled - lisinopril (PRINIVIL,ZESTRIL) 40 MG tablet; Take 1 tablet (40 mg total) by mouth daily.  Dispense: 90 tablet; Refill: 1  2. HLD (hyperlipidemia) Controlled - simvastatin (ZOCOR) 40 MG tablet; Take 1 tablet (40 mg total) by mouth at bedtime. For cholesterol  Dispense: 90 tablet; Refill: 1  3. Special screening for malignant  neoplasms, colon - Ambulatory referral to Gastroenterology  4. Screening for breast cancer - Mammogram Digital Screening; Future  5. Screening for cervical cancer - Cytology - PAP ([place])   Meds ordered this encounter  Medications  . lisinopril (PRINIVIL,ZESTRIL) 40 MG tablet    Sig: Take 1 tablet (40 mg total) by mouth daily.    Dispense:  90 tablet     Refill:  1  . simvastatin (ZOCOR) 40 MG tablet    Sig: Take 1 tablet (40 mg total) by mouth at bedtime. For cholesterol    Dispense:  90 tablet    Refill:  1    Follow-up: Return in 3 months (on 06/11/2016) for follow up on Hypertension.   Jaclyn Shaggy MD

## 2016-03-12 NOTE — Progress Notes (Signed)
Patient here for physical. Refill BP/cholesterol med. Had flu 3 weeks ago not sure if she still needs flu shot.

## 2016-03-15 LAB — CYTOLOGY - PAP

## 2016-03-16 ENCOUNTER — Encounter: Payer: Self-pay | Admitting: Family Medicine

## 2016-03-16 ENCOUNTER — Other Ambulatory Visit: Payer: Self-pay | Admitting: Family Medicine

## 2016-03-16 DIAGNOSIS — Z124 Encounter for screening for malignant neoplasm of cervix: Secondary | ICD-10-CM

## 2016-03-16 DIAGNOSIS — E7849 Other hyperlipidemia: Secondary | ICD-10-CM

## 2016-03-16 DIAGNOSIS — Z1239 Encounter for other screening for malignant neoplasm of breast: Secondary | ICD-10-CM

## 2016-03-16 DIAGNOSIS — I10 Essential (primary) hypertension: Secondary | ICD-10-CM

## 2016-03-16 DIAGNOSIS — Z1211 Encounter for screening for malignant neoplasm of colon: Secondary | ICD-10-CM

## 2016-03-16 MED ORDER — LISINOPRIL 40 MG PO TABS: 40.0000 mg | ORAL_TABLET | Freq: Every day | ORAL | 1 refills | Status: DC

## 2016-03-16 MED ORDER — SIMVASTATIN 40 MG PO TABS: 40.0000 mg | ORAL_TABLET | Freq: Every day | ORAL | 1 refills | Status: DC

## 2016-03-19 ENCOUNTER — Telehealth: Payer: Self-pay

## 2016-03-19 NOTE — Telephone Encounter (Signed)
Writer called patient with Pap Smear results.  Patient stated understanding.

## 2016-03-19 NOTE — Telephone Encounter (Signed)
-----   Message from Jaclyn ShaggyEnobong Amao, MD sent at 03/16/2016  9:31 AM EDT ----- Please inform the patient that labs are normal. Thank you.

## 2016-04-06 ENCOUNTER — Ambulatory Visit: Payer: Self-pay

## 2016-04-19 ENCOUNTER — Telehealth: Payer: Self-pay | Admitting: Family Medicine

## 2016-04-19 NOTE — Telephone Encounter (Signed)
Pt. Came into facility requesting to know about her mammogram referral.  Please f/u with pt.

## 2016-04-19 NOTE — Telephone Encounter (Signed)
Writer called patient back and gave her the number of the [facility] Imaging center through [employer].

## 2016-07-02 ENCOUNTER — Ambulatory Visit: Payer: Self-pay | Admitting: Family Medicine

## 2016-07-13 ENCOUNTER — Encounter: Payer: Self-pay | Admitting: Physician Assistant

## 2016-07-13 ENCOUNTER — Ambulatory Visit: Payer: Self-pay | Attending: Family Medicine | Admitting: Physician Assistant

## 2016-07-13 VITALS — BP 146/78 | HR 73 | Temp 97.7°F | Ht 60.75 in | Wt 116.2 lb

## 2016-07-13 DIAGNOSIS — Z79899 Other long term (current) drug therapy: Secondary | ICD-10-CM | POA: Insufficient documentation

## 2016-07-13 DIAGNOSIS — I10 Essential (primary) hypertension: Secondary | ICD-10-CM | POA: Insufficient documentation

## 2016-07-13 DIAGNOSIS — E784 Other hyperlipidemia: Secondary | ICD-10-CM

## 2016-07-13 DIAGNOSIS — E7849 Other hyperlipidemia: Secondary | ICD-10-CM

## 2016-07-13 DIAGNOSIS — Z1239 Encounter for other screening for malignant neoplasm of breast: Secondary | ICD-10-CM

## 2016-07-13 DIAGNOSIS — E785 Hyperlipidemia, unspecified: Secondary | ICD-10-CM | POA: Insufficient documentation

## 2016-07-13 DIAGNOSIS — Z1231 Encounter for screening mammogram for malignant neoplasm of breast: Secondary | ICD-10-CM

## 2016-07-13 LAB — COMPLETE METABOLIC PANEL WITH GFR
ALBUMIN: 4.6 g/dL (ref 3.6–5.1)
ALT: 17 U/L (ref 6–29)
AST: 19 U/L (ref 10–35)
Alkaline Phosphatase: 45 U/L (ref 33–130)
BILIRUBIN TOTAL: 0.9 mg/dL (ref 0.2–1.2)
BUN: 10 mg/dL (ref 7–25)
CO2: 27 mmol/L (ref 20–31)
CREATININE: 0.63 mg/dL (ref 0.50–1.05)
Calcium: 9.7 mg/dL (ref 8.6–10.4)
Chloride: 104 mmol/L (ref 98–110)
GFR, Est Non African American: >89 mL/min (ref >=60)
GLUCOSE: 120 mg/dL — AB (ref 65–99)
Potassium: 4 mmol/L (ref 3.5–5.3)
SODIUM: 143 mmol/L (ref 135–146)
TOTAL PROTEIN: 8.1 g/dL (ref 6.1–8.1)

## 2016-07-13 LAB — CBC WITH DIFFERENTIAL/PLATELET
BASOS PCT: 1 %
Basophils Absolute: 48 cells/uL (ref 0–200)
EOS ABS: 192 {cells}/uL (ref 15–500)
Eosinophils Relative: 4 %
HCT: 41.5 % (ref 35.0–45.0)
HEMOGLOBIN: 13.7 g/dL (ref 11.7–15.5)
LYMPHS ABS: 1920 {cells}/uL (ref 850–3900)
Lymphocytes Relative: 40 %
MCH: 30.5 pg (ref 27.0–33.0)
MCHC: 33 g/dL (ref 32.0–36.0)
MCV: 92.4 fL (ref 80.0–100.0)
MONOS PCT: 4 %
MPV: 9.6 fL (ref 7.5–12.5)
Monocytes Absolute: 192 cells/uL — ABNORMAL LOW (ref 200–950)
NEUTROS ABS: 2448 {cells}/uL (ref 1500–7800)
Neutrophils Relative %: 51 %
PLATELETS: 292 10*3/uL (ref 140–400)
RBC: 4.49 MIL/uL (ref 3.80–5.10)
RDW: 13.2 % (ref 11.0–15.0)
WBC: 4.8 10*3/uL (ref 3.8–10.8)

## 2016-07-13 LAB — LIPID PANEL
Cholesterol: 161 mg/dL (ref <200)
HDL: 66 mg/dL (ref >50)
LDL CALC: 77 mg/dL (ref <100)
Total CHOL/HDL Ratio: 2.4 Ratio (ref <5.0)
Triglycerides: 92 mg/dL (ref <150)
VLDL: 18 mg/dL (ref <30)

## 2016-07-13 MED ORDER — LISINOPRIL 40 MG PO TABS: 40.0000 mg | ORAL_TABLET | Freq: Every day | ORAL | 1 refills | Status: DC

## 2016-07-13 MED ORDER — SIMVASTATIN 40 MG PO TABS: 40.0000 mg | ORAL_TABLET | Freq: Every day | ORAL | 1 refills | Status: DC

## 2016-07-13 NOTE — Progress Notes (Signed)
Needs lisinopril and simvastatin refilled

## 2016-07-13 NOTE — Progress Notes (Signed)
Patient ID: Amber Grant, female   DOB: 11/09/1960, 56 y.o.   MRN: [id number]    Subjective:  Patient ID: Amber Grant, female    DOB: 04-25-1961  Age: 56 y.o. MRN: [id number]  CC:  Chief Complaint  Patient presents with  . Hypertension    follow up  . Hyperlipidemia  . mammogram    would like one ordered    HPI Amber Grant is a 56 y.o. female with a PMH of   Past Medical History:  Diagnosis Date  . Hypertension     that presents for follow up of hypertension. Feeling well without any complaints. Is usually compliant. Previous blood pressure readings in the 140s systolic and high 70s/low 80s diastolic. Denies chest pain, palpitations, orthopnea, PND, tingling, numbness, weakness, SOB, headache, abdominal pain, f/c/n/v, weight loss, myalgias, arthralgias, rash, or GI/GU symptoms.    Outpatient Medications Prior to Visit  Medication Sig Dispense Refill  . cholecalciferol (VITAMIN D) 1000 UNITS tablet Take 1,000 Units by mouth daily.    . fish oil-omega-3 fatty acids 1000 MG capsule Take 2 capsules (2 g total) by mouth daily. 90 capsule 3  . Multiple Vitamins-Minerals (MULTIVITAMIN PO) Take by mouth.    Marland Kitchen lisinopril (PRINIVIL,ZESTRIL) 40 MG tablet Take 1 tablet (40 mg total) by mouth daily. 90 tablet 1  . simvastatin (ZOCOR) 40 MG tablet Take 1 tablet (40 mg total) by mouth at bedtime. For cholesterol 90 tablet 1  . Chlorphen-Pseudoephed-APAP (FLU/COLD MEDICINE PO) Take by mouth.     No facility-administered medications prior to visit.     ROS Review of Systems  Constitutional: Negative for fatigue.  Eyes: Negative for visual disturbance.  Respiratory: Negative for shortness of breath.   Cardiovascular: Negative for chest pain.  Gastrointestinal: Negative for abdominal pain, nausea and vomiting.  Genitourinary: Negative for difficulty urinating and dysuria.  Musculoskeletal: Negative for arthralgias.  Skin: Negative for rash.  Neurological: Negative for dizziness,  weakness, numbness and headaches.  Psychiatric/Behavioral: Negative for agitation.    Objective:  BP (!) 146/78 (BP Location: Right Arm, Patient Position: Sitting, Cuff Size: Small)   Pulse 73   Temp 97.7 F (36.5 C) (Oral)   Ht 5' 0.75" (1.543 m)   Wt 116 lb 3.2 oz (52.7 kg)   SpO2 100%   BMI 22.14 kg/m   BP/Weight 07/13/2016 03/12/2016 02/19/2016  Systolic BP 146 127 144  Diastolic BP 78 77 82  Wt. (Lbs) 116.2 114 114.8  BMI 22.14 21.54 22.42      Physical Exam  Constitutional: She is oriented to person, place, and time. She appears well-developed and well-nourished. No distress.  HENT:  Head: Normocephalic and atraumatic.  Eyes: Conjunctivae and EOM are normal. No scleral icterus.  Neck: Normal range of motion. Neck supple. No thyromegaly present.  Cardiovascular: Normal rate, regular rhythm and normal heart sounds.   Pulmonary/Chest: Effort normal and breath sounds normal. No respiratory distress. She has no wheezes. She has no rales.  Abdominal: Soft. Bowel sounds are normal. She exhibits no distension. There is no tenderness.  Musculoskeletal: Normal range of motion. She exhibits no edema.  Neurological: She is alert and oriented to person, place, and time.  Skin: Skin is warm and dry. No rash noted. She is not diaphoretic. No erythema. No pallor.  Psychiatric: She has a normal mood and affect. Her behavior is normal. Thought content normal.     Assessment & Plan:   1. Essential hypertension, benign - lisinopril (PRINIVIL,ZESTRIL) 40 MG  tablet; Take 1 tablet (40 mg total) by mouth daily.  Dispense: 90 tablet; Refill: 1 - CBC with Differential - COMPLETE METABOLIC PANEL WITH GFR - Lipid Panel  2. Screening for breast cancer - MM DIGITAL SCREENING BILATERAL; Future -Nurse helping to set up appointment [facility].  -3. Other hyperlipidemia - simvastatin (ZOCOR) 40 MG tablet; Take 1 tablet (40 mg total) by mouth at bedtime. For cholesterol   Dispense: 90 tablet; Refill: 1  *translation services used.  Meds ordered this encounter  Medications  . lisinopril (PRINIVIL,ZESTRIL) 40 MG tablet    Sig: Take 1 tablet (40 mg total) by mouth daily.    Dispense:  90 tablet    Refill:  1    Order Specific Question:   Supervising Provider    Answer:   Quentin AngstJEGEDE, OLUGBEMIGA E [id number]]  . simvastatin (ZOCOR) 40 MG tablet    Sig: Take 1 tablet (40 mg total) by mouth at bedtime. For cholesterol    Dispense:  90 tablet    Refill:  1    Order Specific Question:   Supervising Provider    Answer:   Quentin AngstJEGEDE, OLUGBEMIGA E [id number]    Follow-up: Return in about 7 days (around 07/20/2016) for BP nurse visit.   Loletta Specteroger David Perris Tripathi PA

## 2016-07-13 NOTE — Patient Instructions (Signed)
Hypertension Hypertension, commonly called high blood pressure, is when the force of blood pumping through your arteries is too strong. Your arteries are the blood vessels that carry blood from your heart throughout your body. A blood pressure reading consists of a higher number over a lower number, such as 110/72. The higher number (systolic) is the pressure inside your arteries when your heart pumps. The lower number (diastolic) is the pressure inside your arteries when your heart relaxes. Ideally you want your blood pressure below 120/80. Hypertension forces your heart to work harder to pump blood. Your arteries may become narrow or stiff. Having untreated or uncontrolled hypertension can cause heart attack, stroke, kidney disease, and other problems. What increases the risk? Some risk factors for high blood pressure are controllable. Others are not. Risk factors you cannot control include:  Race. You may be at higher risk if you are African American.  Age. Risk increases with age.  Gender. Men are at higher risk than women before age 45 years. After age 65, women are at higher risk than men. Risk factors you can control include:  Not getting enough exercise or physical activity.  Being overweight.  Getting too much fat, sugar, calories, or salt in your diet.  Drinking too much alcohol. What are the signs or symptoms? Hypertension does not usually cause signs or symptoms. Extremely high blood pressure (hypertensive crisis) may cause headache, anxiety, shortness of breath, and nosebleed. How is this diagnosed? To check if you have hypertension, your health care provider will measure your blood pressure while you are seated, with your arm held at the level of your heart. It should be measured at least twice using the same arm. Certain conditions can cause a difference in blood pressure between your right and left arms. A blood pressure reading that is higher than normal on one occasion does  not mean that you need treatment. If it is not clear whether you have high blood pressure, you may be asked to return on a different day to have your blood pressure checked again. Or, you may be asked to monitor your blood pressure at home for 1 or more weeks. How is this treated? Treating high blood pressure includes making lifestyle changes and possibly taking medicine. Living a healthy lifestyle can help lower high blood pressure. You may need to change some of your habits. Lifestyle changes may include:  Following the DASH diet. This diet is high in fruits, vegetables, and whole grains. It is low in salt, red meat, and added sugars.  Keep your sodium intake below 2,300 mg per day.  Getting at least 30-45 minutes of aerobic exercise at least 4 times per week.  Losing weight if necessary.  Not smoking.  Limiting alcoholic beverages.  Learning ways to reduce stress. Your health care provider may prescribe medicine if lifestyle changes are not enough to get your blood pressure under control, and if one of the following is true:  You are 18-59 years of age and your systolic blood pressure is above 140.  You are 60 years of age or older, and your systolic blood pressure is above 150.  Your diastolic blood pressure is above 90.  You have diabetes, and your systolic blood pressure is over 140 or your diastolic blood pressure is over 90.  You have kidney disease and your blood pressure is above 140/90.  You have heart disease and your blood pressure is above 140/90. Your personal target blood pressure may vary depending on your medical   conditions, your age, and other factors. Follow these instructions at home:  Have your blood pressure rechecked as directed by your health care provider.  Take medicines only as directed by your health care provider. Follow the directions carefully. Blood pressure medicines must be taken as prescribed. The medicine does not work as well when you skip  doses. Skipping doses also puts you at risk for problems.  Do not smoke.  Monitor your blood pressure at home as directed by your health care provider. Contact a health care provider if:  You think you are having a reaction to medicines taken.  You have recurrent headaches or feel dizzy.  You have swelling in your ankles.  You have trouble with your vision. Get help right away if:  You develop a severe headache or confusion.  You have unusual weakness, numbness, or feel faint.  You have severe chest or abdominal pain.  You vomit repeatedly.  You have trouble breathing. This information is not intended to replace advice given to you by your health care provider. Make sure you discuss any questions you have with your health care provider. Document Released: 05/31/2005 Document Revised: 11/06/2015 Document Reviewed: 03/23/2013 Elsevier Interactive Patient Education  2017 Elsevier Inc.  

## 2016-07-14 ENCOUNTER — Telehealth: Payer: Self-pay | Admitting: *Deleted

## 2016-07-14 NOTE — Progress Notes (Signed)
CBC, CMP, and Lipids are normal/unremarkable. Please notify patient. Thank you. Meds refilled yesterday during encounter.

## 2016-07-14 NOTE — Telephone Encounter (Signed)
Name and DOB verified. Pt aware of lab results and f/u appt.

## 2016-07-22 ENCOUNTER — Ambulatory Visit: Payer: Self-pay | Attending: Family Medicine | Admitting: *Deleted

## 2016-07-22 VITALS — BP 150/86

## 2016-07-22 DIAGNOSIS — Z79899 Other long term (current) drug therapy: Secondary | ICD-10-CM | POA: Insufficient documentation

## 2016-07-22 DIAGNOSIS — I10 Essential (primary) hypertension: Secondary | ICD-10-CM | POA: Insufficient documentation

## 2016-07-27 ENCOUNTER — Ambulatory Visit: Payer: Self-pay | Attending: Family Medicine

## 2016-07-29 NOTE — Progress Notes (Signed)
Pt here for f/u BP. Accompanied by adult son. Pt denies chest pain, SOB, HA, new vison concerns, or generalized swelling. Blood pressure taken manually while patient is sitting. Nurse visit will be routed to referring provider.Guy Francoravia Kirah Stice, RN

## 2016-08-17 ENCOUNTER — Ambulatory Visit: Payer: Self-pay | Attending: Family Medicine

## 2017-01-04 ENCOUNTER — Telehealth: Payer: Self-pay | Admitting: Family Medicine

## 2017-01-04 NOTE — Telephone Encounter (Signed)
Pt came in requesting a refill of lisinopril and simvastatin for a 6 month supply. Requests a call from CMA to advise when script is sent to pharmacy. Please f/u. Thank you.

## 2017-01-04 NOTE — Telephone Encounter (Signed)
Last seen in 06/2016. Needs an OV

## 2017-01-06 ENCOUNTER — Other Ambulatory Visit: Payer: Self-pay | Admitting: Physician Assistant

## 2017-01-06 ENCOUNTER — Other Ambulatory Visit: Payer: Self-pay | Admitting: Family Medicine

## 2017-01-06 DIAGNOSIS — I10 Essential (primary) hypertension: Secondary | ICD-10-CM

## 2017-01-06 DIAGNOSIS — E7849 Other hyperlipidemia: Secondary | ICD-10-CM

## 2017-01-06 MED ORDER — SIMVASTATIN 40 MG PO TABS: 40.0000 mg | ORAL_TABLET | Freq: Every day | ORAL | 1 refills | Status: DC

## 2017-01-06 MED ORDER — LISINOPRIL 40 MG PO TABS: 40.0000 mg | ORAL_TABLET | Freq: Every day | ORAL | 1 refills | Status: DC

## 2017-01-06 NOTE — Telephone Encounter (Signed)
FWD to PCP. Tempestt S Roberts, CMA  

## 2017-01-18 ENCOUNTER — Ambulatory Visit: Payer: Self-pay | Admitting: Family Medicine

## 2017-01-31 ENCOUNTER — Ambulatory Visit: Payer: Self-pay | Attending: Family Medicine

## 2017-03-18 ENCOUNTER — Encounter: Payer: Self-pay | Admitting: Family Medicine

## 2017-03-18 ENCOUNTER — Ambulatory Visit: Payer: Self-pay | Attending: Family Medicine | Admitting: Family Medicine

## 2017-03-18 VITALS — BP 152/82 | HR 94 | Temp 98.0°F | Ht 61.0 in | Wt 113.0 lb

## 2017-03-18 DIAGNOSIS — E7849 Other hyperlipidemia: Secondary | ICD-10-CM | POA: Insufficient documentation

## 2017-03-18 DIAGNOSIS — Z1239 Encounter for other screening for malignant neoplasm of breast: Secondary | ICD-10-CM

## 2017-03-18 DIAGNOSIS — Z23 Encounter for immunization: Secondary | ICD-10-CM | POA: Insufficient documentation

## 2017-03-18 DIAGNOSIS — Z1159 Encounter for screening for other viral diseases: Secondary | ICD-10-CM | POA: Insufficient documentation

## 2017-03-18 DIAGNOSIS — Z79899 Other long term (current) drug therapy: Secondary | ICD-10-CM | POA: Insufficient documentation

## 2017-03-18 DIAGNOSIS — Z1231 Encounter for screening mammogram for malignant neoplasm of breast: Secondary | ICD-10-CM

## 2017-03-18 DIAGNOSIS — Z0000 Encounter for general adult medical examination without abnormal findings: Secondary | ICD-10-CM

## 2017-03-18 DIAGNOSIS — I10 Essential (primary) hypertension: Secondary | ICD-10-CM | POA: Insufficient documentation

## 2017-03-18 MED ORDER — LISINOPRIL 40 MG PO TABS: 40.0000 mg | ORAL_TABLET | Freq: Every day | ORAL | 1 refills | Status: DC

## 2017-03-18 MED ORDER — SIMVASTATIN 40 MG PO TABS: 40.0000 mg | ORAL_TABLET | Freq: Every day | ORAL | 1 refills | Status: DC

## 2017-03-18 NOTE — Addendum Note (Signed)
Addended by: Ronette Deter on: 03/18/2017 11:57 AM   Modules accepted: Orders

## 2017-03-18 NOTE — Progress Notes (Signed)
Subjective:  Patient ID: Amber Grant, female    DOB: [birth year]  Age: 56 y.o. MRN: [id number]  CC: Annual Exam   HPI Amber Grant is a 56 year old female with a history of hypertension, hyperlipidemia who presents today for a follow-up visit. She has been compliant with her antihypertensive and statin and denies side effects. She denies chest pains or shortness of breath and has no additional complaints.  Past Medical History:  Diagnosis Date  . Hypertension     No past surgical history on file.  No Known Allergies   Outpatient Medications Prior to Visit  Medication Sig Dispense Refill  . cholecalciferol (VITAMIN D) 1000 UNITS tablet Take 1,000 Units by mouth daily.    . fish oil-omega-3 fatty acids 1000 MG capsule Take 2 capsules (2 g total) by mouth daily. 90 capsule 3  . Multiple Vitamins-Minerals (MULTIVITAMIN PO) Take by mouth.    Marland Kitchen lisinopril (PRINIVIL,ZESTRIL) 40 MG tablet TAKE 1 TABLET BY MOUTH ONCE DAILY 90 tablet 1  . simvastatin (ZOCOR) 40 MG tablet Take 1 tablet (40 mg total) by mouth at bedtime. For cholesterol 90 tablet 1   No facility-administered medications prior to visit.     ROS Review of Systems  Constitutional: Negative for activity change, appetite change and fatigue.  HENT: Negative for congestion, sinus pressure and sore throat.   Eyes: Negative for visual disturbance.  Respiratory: Negative for cough, chest tightness, shortness of breath and wheezing.   Cardiovascular: Negative for chest pain and palpitations.  Gastrointestinal: Negative for abdominal distention, abdominal pain and constipation.  Endocrine: Negative for polydipsia.  Genitourinary: Negative for dysuria and frequency.  Musculoskeletal: Negative for arthralgias and back pain.  Skin: Negative for rash.  Neurological: Negative for tremors, light-headedness and numbness.  Hematological: Does not bruise/bleed easily.  Psychiatric/Behavioral: Negative for agitation and behavioral  problems.    Objective:  BP (!) 152/82   Pulse 94   Temp 98 F (36.7 C) (Oral)   Ht  (1.549 m)   Wt 113 lb (51.3 kg)   SpO2 100%   BMI 21.35 kg/m   BP/Weight 03/18/2017 07/22/2016 07/13/2016  Systolic BP 152 150 146  Diastolic BP 82 86 78  Wt. (Lbs) 113 - 116.2  BMI 21.35 - 22.14      Physical Exam  Constitutional: She is oriented to person, place, and time. She appears well-developed and well-nourished. No distress.  HENT:  Head: Normocephalic.  Right Ear: External ear normal.  Left Ear: External ear normal.  Nose: Nose normal.  Mouth/Throat: Oropharynx is clear and moist.  Eyes: Pupils are equal, round, and reactive to light. Conjunctivae and EOM are normal.  Neck: Normal range of motion. No JVD present.  Cardiovascular: Normal rate, regular rhythm, normal heart sounds and intact distal pulses.  Exam reveals no gallop.   No murmur heard. Pulmonary/Chest: Effort normal and breath sounds normal. No respiratory distress. She has no wheezes. She has no rales. She exhibits no tenderness. Right breast exhibits no mass, no skin change and no tenderness. Left breast exhibits no mass, no skin change and no tenderness.  Abdominal: Soft. Bowel sounds are normal. She exhibits no distension and no mass. There is no tenderness.  Genitourinary:  Genitourinary Comments: External genitalia, vagina, cervix, adnexa all normal  Musculoskeletal: Normal range of motion. She exhibits no edema or tenderness.  Neurological: She is alert and oriented to person, place, and time. She has normal reflexes.  Skin: Skin is warm and dry. She is  not diaphoretic.  Psychiatric: She has a normal mood and affect.     Assessment & Plan:   1. Essential hypertension, benign Fasting labs today Low-sodium, DASH diet - lisinopril (PRINIVIL,ZESTRIL) 40 MG tablet; Take 1 tablet (40 mg total) by mouth daily.  Dispense: 90 tablet; Refill: 1  2. Other hyperlipidemia Low cholesterol diet - simvastatin  (ZOCOR) 40 MG tablet; Take 1 tablet (40 mg total) by mouth at bedtime. For cholesterol  Dispense: 90 tablet; Refill: 1  3. Annual physical exam Mammogram, Pap today Influenza and TW administered She declines colonoscopy due to cost  4. Screening for breast cancer Referred for mammogram  5. Screening for viral disease - Hepatitis c antibody (reflex) - HIV antibody (with reflex)   Meds ordered this encounter  Medications  . lisinopril (PRINIVIL,ZESTRIL) 40 MG tablet    Sig: Take 1 tablet (40 mg total) by mouth daily.    Dispense:  90 tablet    Refill:  1  . simvastatin (ZOCOR) 40 MG tablet    Sig: Take 1 tablet (40 mg total) by mouth at bedtime. For cholesterol    Dispense:  90 tablet    Refill:  1    Follow-up: Return in about 6 months (around 09/16/2017) for follow up of hypertension and hyperlipidemia.   Jaclyn Shaggy MD

## 2017-03-18 NOTE — Patient Instructions (Signed)

## 2017-03-19 LAB — CMP14+EGFR
ALT: 17 IU/L (ref 0–32)
AST: 20 IU/L (ref 0–40)
Albumin/Globulin Ratio: 1.7 (ref 1.2–2.2)
Albumin: 5 g/dL (ref 3.5–5.5)
Alkaline Phosphatase: 47 IU/L (ref 39–117)
BILIRUBIN TOTAL: 0.6 mg/dL (ref 0.0–1.2)
BUN/Creatinine Ratio: 15 (ref 9–23)
BUN: 9 mg/dL (ref 6–24)
CALCIUM: 9.6 mg/dL (ref 8.7–10.2)
CHLORIDE: 98 mmol/L (ref 96–106)
CO2: 22 mmol/L (ref 20–29)
Creatinine, Ser: 0.62 mg/dL (ref 0.57–1.00)
GFR, EST AFRICAN AMERICAN: 118 mL/min/{1.73_m2} (ref >59)
GFR, EST NON AFRICAN AMERICAN: 103 mL/min/{1.73_m2} (ref >59)
GLUCOSE: 112 mg/dL — AB (ref 65–99)
Globulin, Total: 3 g/dL (ref 1.5–4.5)
Potassium: 4.1 mmol/L (ref 3.5–5.2)
Sodium: 138 mmol/L (ref 134–144)
TOTAL PROTEIN: 8 g/dL (ref 6.0–8.5)

## 2017-03-19 LAB — LIPID PANEL
Chol/HDL Ratio: 2.8 ratio (ref 0.0–4.4)
Cholesterol, Total: 168 mg/dL (ref 100–199)
HDL: 60 mg/dL (ref >39)
LDL Calculated: 87 mg/dL (ref 0–99)
TRIGLYCERIDES: 105 mg/dL (ref 0–149)
VLDL CHOLESTEROL CAL: 21 mg/dL (ref 5–40)

## 2017-03-19 LAB — HEPATITIS C ANTIBODY (REFLEX): HCV Ab: <0.1 s/co ratio (ref 0.0–0.9)

## 2017-03-19 LAB — HIV ANTIBODY (ROUTINE TESTING W REFLEX): HIV SCREEN 4TH GENERATION: NONREACTIVE

## 2017-03-19 LAB — HCV COMMENT:

## 2017-03-21 LAB — CYTOLOGY - PAP: Diagnosis: NEGATIVE

## 2017-03-24 ENCOUNTER — Telehealth: Payer: Self-pay

## 2017-03-24 NOTE — Telephone Encounter (Signed)
Pt was called and informed of lab results.  [id number] 

## 2017-04-01 ENCOUNTER — Encounter (HOSPITAL_COMMUNITY): Payer: Self-pay | Admitting: *Deleted

## 2017-04-11 ENCOUNTER — Other Ambulatory Visit: Payer: Self-pay | Admitting: Obstetrics and Gynecology

## 2017-04-11 DIAGNOSIS — Z1231 Encounter for screening mammogram for malignant neoplasm of breast: Secondary | ICD-10-CM

## 2017-05-03 ENCOUNTER — Ambulatory Visit
Admission: RE | Admit: 2017-05-03 | Discharge: 2017-05-03 | Disposition: A | Discharge status: Home / Self Care | Payer: No Typology Code available for payment source | Source: Ambulatory Visit | Attending: Obstetrics and Gynecology | Admitting: Obstetrics and Gynecology

## 2017-05-03 ENCOUNTER — Encounter (HOSPITAL_COMMUNITY): Payer: Self-pay

## 2017-05-03 ENCOUNTER — Ambulatory Visit (HOSPITAL_COMMUNITY)
Admission: RE | Admit: 2017-05-03 | Discharge: 2017-05-03 | Disposition: A | Discharge status: Home / Self Care | Payer: Self-pay | Source: Ambulatory Visit | Attending: Obstetrics and Gynecology | Admitting: Obstetrics and Gynecology

## 2017-05-03 DIAGNOSIS — Z1239 Encounter for other screening for malignant neoplasm of breast: Secondary | ICD-10-CM

## 2017-05-03 DIAGNOSIS — Z1231 Encounter for screening mammogram for malignant neoplasm of breast: Secondary | ICD-10-CM

## 2017-05-03 NOTE — Patient Instructions (Signed)
Explained breast self awareness with Tristan SchroederLien My Marshall Corkhan. Patient did not need a Pap smear today due to last Pap smear was 03/18/2017. Let her know [facility] will cover Pap smears every 3 years unless has a history of abnormal Pap smears. [facility]. Appointment scheduled for Tuesday, May 03, 2017 at 1110. Let patient know the [facility] will follow up with her within the next couple weeks with results of mammogram by letter or phone. Lien My Marshall Corkhan verbalized understanding.  Tonianne Fine, Kathaleen Maserhristine Poll, RN 10:05 AM

## 2017-05-03 NOTE — Progress Notes (Signed)
No complaints today.   Pap Smear: Pap smear not completed today. [facility] and normal. Per patient has no history of an abnormal Pap smear. Last Pap smear result is in Epic.  Physical exam: Breasts Breasts symmetrical. No skin abnormalities bilateral breasts. No nipple retraction bilateral breasts. No nipple discharge bilateral breasts. No lymphadenopathy. No lumps palpated bilateral breasts. No complaints of pain or tenderness on exam. [facility] for a screening mammogram. Appointment scheduled for Tuesday, May 03, 2017 at 1110.        Pelvic/Bimanual No Pap smear completed today since last Pap smear was 03/18/2017. Pap smear not indicated per [facility] guidelines.   Smoking History: Patient has never smoked.  Patient Navigation: Patient education provided. [facility]. Patient speaks [place]. Consent signed for son Edgardo Royslex Pham to interpret for patient.   Colorectal Cancer Screening: Per patient has never had a colonoscopy completed. No complaints today. FIT Test given to patient to complete and return to [facility].

## 2017-05-04 ENCOUNTER — Encounter (HOSPITAL_COMMUNITY): Payer: Self-pay | Admitting: *Deleted

## 2017-05-10 ENCOUNTER — Other Ambulatory Visit: Payer: Self-pay

## 2017-05-12 LAB — FECAL OCCULT BLOOD, IMMUNOCHEMICAL: Fecal Occult Bld: NEGATIVE

## 2017-05-12 LAB — SPECIMEN STATUS REPORT

## 2017-05-13 ENCOUNTER — Encounter (HOSPITAL_COMMUNITY): Payer: Self-pay | Admitting: *Deleted

## 2017-05-13 NOTE — Progress Notes (Signed)
Letter mailed to patient with negative Fit Test results.  

## 2017-08-01 ENCOUNTER — Telehealth: Payer: Self-pay | Admitting: Family Medicine

## 2017-08-01 DIAGNOSIS — I10 Essential (primary) hypertension: Secondary | ICD-10-CM

## 2017-08-01 DIAGNOSIS — E7849 Other hyperlipidemia: Secondary | ICD-10-CM

## 2017-08-01 NOTE — Telephone Encounter (Signed)
Pt came in to request a refill on  -simvastatin (ZOCOR) 40 MG tablet  -lisinopril (PRINIVIL,ZESTRIL) 40 MG tablet  To [facility] pharmacy please follow up

## 2017-08-01 NOTE — Telephone Encounter (Signed)
Will route to PCP 

## 2017-08-02 MED ORDER — LISINOPRIL 40 MG PO TABS: 40.0000 mg | ORAL_TABLET | Freq: Every day | ORAL | 1 refills | Status: DC

## 2017-08-02 MED ORDER — SIMVASTATIN 40 MG PO TABS: 40.0000 mg | ORAL_TABLET | Freq: Every day | ORAL | 1 refills | Status: DC

## 2017-08-02 NOTE — Addendum Note (Signed)
Addended byHoy Register: Iya Hamed on: 08/02/2017 08:40 AM   Modules accepted: Orders

## 2017-08-02 NOTE — Telephone Encounter (Signed)
Refilled

## 2017-08-10 ENCOUNTER — Ambulatory Visit: Payer: No Typology Code available for payment source | Attending: Family Medicine

## 2017-09-05 ENCOUNTER — Ambulatory Visit: Payer: Self-pay | Attending: Family Medicine | Admitting: Family Medicine

## 2017-09-05 ENCOUNTER — Encounter: Payer: Self-pay | Admitting: Family Medicine

## 2017-09-05 DIAGNOSIS — E785 Hyperlipidemia, unspecified: Secondary | ICD-10-CM | POA: Insufficient documentation

## 2017-09-05 DIAGNOSIS — Z79899 Other long term (current) drug therapy: Secondary | ICD-10-CM | POA: Insufficient documentation

## 2017-09-05 DIAGNOSIS — E7800 Pure hypercholesterolemia, unspecified: Secondary | ICD-10-CM | POA: Insufficient documentation

## 2017-09-05 DIAGNOSIS — I10 Essential (primary) hypertension: Secondary | ICD-10-CM | POA: Insufficient documentation

## 2017-09-05 MED ORDER — ATORVASTATIN CALCIUM 40 MG PO TABS
40.0000 mg | ORAL_TABLET | Freq: Every day | ORAL | 5 refills | Status: DC
Start: 2017-09-05 — End: 2017-12-21

## 2017-09-05 MED ORDER — AMLODIPINE BESYLATE 10 MG PO TABS: 10.0000 mg | ORAL_TABLET | Freq: Every day | ORAL | 5 refills | Status: DC

## 2017-09-05 MED ORDER — LISINOPRIL 40 MG PO TABS: 40.0000 mg | ORAL_TABLET | Freq: Every day | ORAL | 5 refills | Status: DC

## 2017-09-05 NOTE — Patient Instructions (Signed)

## 2017-09-05 NOTE — Progress Notes (Signed)
Subjective:  Patient ID: Amber Grant, female    DOB: Aug 29, 1960  Age: 57 y.o. MRN: [id number]  CC: Hypertension   HPI Amber Saxon My Klammer is a 57 year old female with a history of hypertension, hyperlipidemia who presents today for a follow-up visit. Blood pressure is elevated and initially states she has been compliant with her antihypertensive but then a few minutes after states she is unsure if she took her medications but review of her chart indicates blood pressure at her last office visit was in the 660Y systolic. She tolerates her statin with no complaints of myalgias. Denies chest pain, shortness of breath, pedal edema.  Past Medical History:  Diagnosis Date  . Hypertension     History reviewed. No pertinent surgical history.  No Known Allergies   Outpatient Medications Prior to Visit  Medication Sig Dispense Refill  . cholecalciferol (VITAMIN D) 1000 UNITS tablet Take 1,000 Units by mouth daily.    . fish oil-omega-3 fatty acids 1000 MG capsule Take 2 capsules (2 g total) by mouth daily. 90 capsule 3  . Multiple Vitamins-Minerals (MULTIVITAMIN PO) Take by mouth.    Marland Kitchen lisinopril (PRINIVIL,ZESTRIL) 40 MG tablet Take 1 tablet (40 mg total) by mouth daily. 90 tablet 1  . simvastatin (ZOCOR) 40 MG tablet Take 1 tablet (40 mg total) by mouth at bedtime. For cholesterol 90 tablet 1   No facility-administered medications prior to visit.     ROS Review of Systems  Constitutional: Negative for activity change, appetite change and fatigue.  HENT: Negative for congestion, sinus pressure and sore throat.   Eyes: Negative for visual disturbance.  Respiratory: Negative for cough, chest tightness, shortness of breath and wheezing.   Cardiovascular: Negative for chest pain and palpitations.  Gastrointestinal: Negative for abdominal distention, abdominal pain and constipation.  Endocrine: Negative for polydipsia.  Genitourinary: Negative for dysuria and frequency.  Musculoskeletal:  Negative for arthralgias and back pain.  Skin: Negative for rash.  Neurological: Negative for tremors, light-headedness and numbness.  Hematological: Does not bruise/bleed easily.  Psychiatric/Behavioral: Negative for agitation and behavioral problems.    Objective:  BP (!) 185/84   Pulse 94   Temp 97.8 F (36.6 C)   Wt 118 lb 3.2 oz (53.6 kg)   SpO2 100%   BMI 22.33 kg/m   BP/Weight 09/05/2017 30/06/6008 02/14/2354  Systolic BP 732 202 542  Diastolic BP 84 82 86  Wt. (Lbs) 118.2 113 -  BMI 22.33 21.35 -      Physical Exam  Constitutional: She is oriented to person, place, and time. She appears well-developed and well-nourished.  Cardiovascular: Normal rate, normal heart sounds and intact distal pulses.  No murmur heard. Pulmonary/Chest: Effort normal and breath sounds normal. She has no wheezes. She has no rales. She exhibits no tenderness.  Abdominal: Soft. Bowel sounds are normal. She exhibits no distension and no mass. There is no tenderness.  Musculoskeletal: Normal range of motion.  Neurological: She is alert and oriented to person, place, and time.  Skin: Skin is warm and dry.  Psychiatric: She has a normal mood and affect.     Assessment & Plan:   1. Essential hypertension, benign Controlled Low sodium diet Amlodipine added to regimen - lisinopril (PRINIVIL,ZESTRIL) 40 MG tablet; Take 1 tablet (40 mg total) by mouth daily.  Dispense: 30 tablet; Refill: 5 - amLODipine (NORVASC) 10 MG tablet; Take 1 tablet (10 mg total) by mouth daily.  Dispense: 30 tablet; Refill: 5 - CMP14+EGFR - Lipid panel  2. Pure hypercholesterolemia Controlled  Switched from simvastatin to atorvastatin due to interaction of the form with amlodipine which will be initiating today. Low-cholesterol diet - atorvastatin (LIPITOR) 40 MG tablet; Take 1 tablet (40 mg total) by mouth daily.  Dispense: 30 tablet; Refill: 5   Meds ordered this encounter  Medications  . lisinopril  (PRINIVIL,ZESTRIL) 40 MG tablet    Sig: Take 1 tablet (40 mg total) by mouth daily.    Dispense:  30 tablet    Refill:  5  . atorvastatin (LIPITOR) 40 MG tablet    Sig: Take 1 tablet (40 mg total) by mouth daily.    Dispense:  30 tablet    Refill:  5  . amLODipine (NORVASC) 10 MG tablet    Sig: Take 1 tablet (10 mg total) by mouth daily.    Dispense:  30 tablet    Refill:  5    Follow-up: Return in about 3 months (around 12/06/2017) for Follow-up of hypertension and hyperlipidemia.   Charlott Rakes MD

## 2017-09-06 LAB — CMP14+EGFR
ALBUMIN: 4.9 g/dL (ref 3.5–5.5)
ALK PHOS: 48 IU/L (ref 39–117)
ALT: 26 IU/L (ref 0–32)
AST: 20 IU/L (ref 0–40)
Albumin/Globulin Ratio: 1.6 (ref 1.2–2.2)
BUN/Creatinine Ratio: 19 (ref 9–23)
BUN: 13 mg/dL (ref 6–24)
Bilirubin Total: 0.5 mg/dL (ref 0.0–1.2)
CO2: 21 mmol/L (ref 20–29)
CREATININE: 0.67 mg/dL (ref 0.57–1.00)
Calcium: 9.3 mg/dL (ref 8.7–10.2)
Chloride: 103 mmol/L (ref 96–106)
GFR calc Af Amer: 114 mL/min/{1.73_m2} (ref >59)
GFR, EST NON AFRICAN AMERICAN: 99 mL/min/{1.73_m2} (ref >59)
GLOBULIN, TOTAL: 3 g/dL (ref 1.5–4.5)
Glucose: 115 mg/dL — ABNORMAL HIGH (ref 65–99)
POTASSIUM: 4.2 mmol/L (ref 3.5–5.2)
SODIUM: 139 mmol/L (ref 134–144)
Total Protein: 7.9 g/dL (ref 6.0–8.5)

## 2017-09-06 LAB — LIPID PANEL
CHOL/HDL RATIO: 2.4 ratio (ref 0.0–4.4)
CHOLESTEROL TOTAL: 153 mg/dL (ref 100–199)
HDL: 63 mg/dL (ref >39)
LDL CALC: 75 mg/dL (ref 0–99)
Triglycerides: 73 mg/dL (ref 0–149)
VLDL Cholesterol Cal: 15 mg/dL (ref 5–40)

## 2017-09-08 ENCOUNTER — Telehealth: Payer: Self-pay

## 2017-09-08 NOTE — Telephone Encounter (Signed)
Patient was called and informed of alb results.

## 2017-11-16 ENCOUNTER — Ambulatory Visit: Payer: Self-pay | Attending: Family Medicine

## 2017-12-06 ENCOUNTER — Ambulatory Visit: Payer: Self-pay | Admitting: Family Medicine

## 2017-12-14 ENCOUNTER — Ambulatory Visit: Payer: Self-pay | Admitting: Family Medicine

## 2017-12-21 ENCOUNTER — Encounter: Payer: Self-pay | Admitting: Family Medicine

## 2017-12-21 ENCOUNTER — Ambulatory Visit: Payer: Self-pay | Attending: Family Medicine | Admitting: Family Medicine

## 2017-12-21 DIAGNOSIS — I10 Essential (primary) hypertension: Secondary | ICD-10-CM

## 2017-12-21 DIAGNOSIS — Z79899 Other long term (current) drug therapy: Secondary | ICD-10-CM | POA: Insufficient documentation

## 2017-12-21 DIAGNOSIS — E7800 Pure hypercholesterolemia, unspecified: Secondary | ICD-10-CM

## 2017-12-21 DIAGNOSIS — E785 Hyperlipidemia, unspecified: Secondary | ICD-10-CM | POA: Insufficient documentation

## 2017-12-21 MED ORDER — LISINOPRIL 40 MG PO TABS: 40.0000 mg | ORAL_TABLET | Freq: Every day | ORAL | 6 refills | Status: DC

## 2017-12-21 MED ORDER — ATORVASTATIN CALCIUM 40 MG PO TABS: 40.0000 mg | ORAL_TABLET | Freq: Every day | ORAL | 6 refills | Status: DC

## 2017-12-21 NOTE — Patient Instructions (Signed)

## 2017-12-21 NOTE — Progress Notes (Signed)
Subjective:  Patient ID: Amber Grant, female    DOB: 10-07-1960  Age: 57 y.o. MRN: [id number]  CC: Hypertension   HPI Amber Schroeder [place] is a 57 year old female with a history of hypertension, hyperlipidemia who presents today accompanied by family member for follow-up visit and is seen with the aid of a [place] video interpreter. She endorses compliance with her antihypertensive and statin and reports blood pressures at home have been in the 110/60 range. She denies chest pains, shortness of breath. She has no additional concerns today.  Past Medical History:  Diagnosis Date  . Hypertension     No past surgical history on file.  No Known Allergies    Outpatient Medications Prior to Visit  Medication Sig Dispense Refill  . atorvastatin (LIPITOR) 40 MG tablet Take 1 tablet (40 mg total) by mouth daily. 30 tablet 5  . lisinopril (PRINIVIL,ZESTRIL) 40 MG tablet Take 1 tablet (40 mg total) by mouth daily. 30 tablet 5  . amLODipine (NORVASC) 10 MG tablet Take 1 tablet (10 mg total) by mouth daily. (Patient not taking: Reported on 12/21/2017) 30 tablet 5  . cholecalciferol (VITAMIN D) 1000 UNITS tablet Take 1,000 Units by mouth daily.    . fish oil-omega-3 fatty acids 1000 MG capsule Take 2 capsules (2 g total) by mouth daily. (Patient not taking: Reported on 12/21/2017) 90 capsule 3  . Multiple Vitamins-Minerals (MULTIVITAMIN PO) Take by mouth.     No facility-administered medications prior to visit.     ROS Review of Systems  Constitutional: Negative for activity change, appetite change and fatigue.  HENT: Negative for congestion, sinus pressure and sore throat.   Eyes: Negative for visual disturbance.  Respiratory: Negative for cough, chest tightness, shortness of breath and wheezing.   Cardiovascular: Negative for chest pain and palpitations.  Gastrointestinal: Negative for abdominal distention, abdominal pain and constipation.  Endocrine: Negative for polydipsia.    Genitourinary: Negative for dysuria and frequency.  Musculoskeletal: Negative for arthralgias and back pain.  Skin: Negative for rash.  Neurological: Negative for tremors, light-headedness and numbness.  Hematological: Does not bruise/bleed easily.  Psychiatric/Behavioral: Negative for agitation and behavioral problems.    Objective:  BP 138/80   Pulse 94   Temp 98.5 F (36.9 C) (Oral)   Ht 5\' 1"  (1.549 m)   Wt 118 lb (53.5 kg)   SpO2 99%   BMI 22.30 kg/m   BP/Weight 12/21/2017 09/05/2017 03/18/2017  Systolic BP 138 185 152  Diastolic BP 80 84 82  Wt. (Lbs) 118 118.2 113  BMI 22.3 22.33 21.35      Physical Exam  Constitutional: She is oriented to person, place, and time. She appears well-developed and well-nourished.  HENT:  Right Ear: External ear normal.  Left Ear: External ear normal.  Mouth/Throat: Oropharynx is clear and moist.  Cardiovascular: Normal rate, normal heart sounds and intact distal pulses.  No murmur heard. Pulmonary/Chest: Effort normal and breath sounds normal. She has no wheezes. She has no rales. She exhibits no tenderness.  Abdominal: Soft. Bowel sounds are normal. She exhibits no distension and no mass. There is no tenderness.  Musculoskeletal: Normal range of motion.  Neurological: She is alert and oriented to person, place, and time.  Skin: Skin is warm and dry.    CMP Latest Ref Rng & Units 09/05/2017 03/18/2017 07/13/2016  Glucose 65 - 99 mg/dL 409(W) 119(J) 478(G)  BUN 6 - 24 mg/dL 13 9 10   Creatinine 0.57 - 1.00 mg/dL 9.56 2.13 0.86  Sodium 134 - 144 mmol/L 139 138 143  Potassium 3.5 - 5.2 mmol/L 4.2 4.1 4.0  Chloride 96 - 106 mmol/L 103 98 104  CO2 20 - 29 mmol/L 21 22 27   Calcium 8.7 - 10.2 mg/dL 9.3 9.6 9.7  Total Protein 6.0 - 8.5 g/dL 7.9 8.0 8.1  Total Bilirubin 0.0 - 1.2 mg/dL 0.5 0.6 0.9  Alkaline Phos 39 - 117 IU/L 48 47 45  AST 0 - 40 IU/L 20 20 19   ALT 0 - 32 IU/L 26 17 17     Lipid Panel     Component Value Date/Time    CHOL 153 09/05/2017 0922   TRIG 73 09/05/2017 0922   HDL 63 09/05/2017 0922   CHOLHDL 2.4 09/05/2017 0922   CHOLHDL 2.4 07/13/2016 0936   VLDL 18 07/13/2016 0936   LDLCALC 75 09/05/2017 0922     Assessment & Plan:   1. Essential hypertension, benign Controlled Counseled on blood pressure goal of less than 130/80, low-sodium, DASH diet, medication compliance, 150 minutes of moderate intensity exercise per week. Discussed medication compliance, adverse effects. - lisinopril (PRINIVIL,ZESTRIL) 40 MG tablet; Take 1 tablet (40 mg total) by mouth daily.  Dispense: 30 tablet; Refill: 6  2. Pure hypercholesterolemia Controlled  Low-cholesterol diet - atorvastatin (LIPITOR) 40 MG tablet; Take 1 tablet (40 mg total) by mouth daily.  Dispense: 30 tablet; Refill: 6   Meds ordered this encounter  Medications  . lisinopril (PRINIVIL,ZESTRIL) 40 MG tablet    Sig: Take 1 tablet (40 mg total) by mouth daily.    Dispense:  30 tablet    Refill:  6  . atorvastatin (LIPITOR) 40 MG tablet    Sig: Take 1 tablet (40 mg total) by mouth daily.    Dispense:  30 tablet    Refill:  6    Follow-up: Return in about 6 months (around 06/23/2018) for Follow-up of chronic medical conditions.   Hoy RegisterEnobong Rhoderick Farrel MD

## 2018-05-31 ENCOUNTER — Ambulatory Visit: Payer: Self-pay | Attending: Family Medicine

## 2018-07-03 ENCOUNTER — Ambulatory Visit: Payer: Self-pay | Attending: Family Medicine | Admitting: Family Medicine

## 2018-07-03 ENCOUNTER — Encounter: Payer: Self-pay | Admitting: Family Medicine

## 2018-07-03 VITALS — BP 140/80 | HR 102 | Temp 97.6°F | Ht 61.0 in | Wt 118.0 lb

## 2018-07-03 DIAGNOSIS — E7800 Pure hypercholesterolemia, unspecified: Secondary | ICD-10-CM

## 2018-07-03 DIAGNOSIS — I10 Essential (primary) hypertension: Secondary | ICD-10-CM

## 2018-07-03 DIAGNOSIS — Z131 Encounter for screening for diabetes mellitus: Secondary | ICD-10-CM

## 2018-07-03 MED ORDER — ATORVASTATIN CALCIUM 40 MG PO TABS: 40.0000 mg | ORAL_TABLET | Freq: Every day | ORAL | 6 refills | Status: DC

## 2018-07-03 MED ORDER — LISINOPRIL 40 MG PO TABS: 40.0000 mg | ORAL_TABLET | Freq: Every day | ORAL | 6 refills | Status: DC

## 2018-07-03 NOTE — Progress Notes (Signed)
Subjective:  Patient ID: Amber Grant, female    DOB: 11/27/1960  Age: 58 y.o. MRN: [id number]  CC: Hypertension   HPI Amber Grant is a 58 year old female with a history of hypertension, hyperlipidemia who presents today accompanied by a family member for follow-up visit. She informs me her blood pressures at home have been controlled with systolics in the 595 range.  Review of her med list indicates she should be on amlodipine and lisinopril however she has not been taking amlodipine because she complained of dizziness with it. She denies chest pains or shortness of breath and has been compliant with a low-sodium diet. Review of her chart indicates previously elevated fasting blood sugar of 115 and the last A1c of 5.7. She has no additional concerns today.  Past Medical History:  Diagnosis Date  . Hypertension     History reviewed. No pertinent surgical history.  No Known Allergies   Outpatient Medications Prior to Visit  Medication Sig Dispense Refill  . Multiple Vitamins-Minerals (MULTIVITAMIN PO) Take by mouth.    Marland Kitchen atorvastatin (LIPITOR) 40 MG tablet Take 1 tablet (40 mg total) by mouth daily. 30 tablet 6  . lisinopril (PRINIVIL,ZESTRIL) 40 MG tablet Take 1 tablet (40 mg total) by mouth daily. 30 tablet 6  . cholecalciferol (VITAMIN D) 1000 UNITS tablet Take 1,000 Units by mouth daily.    . fish oil-omega-3 fatty acids 1000 MG capsule Take 2 capsules (2 g total) by mouth daily. (Patient not taking: Reported on 12/21/2017) 90 capsule 3  . amLODipine (NORVASC) 10 MG tablet Take 1 tablet (10 mg total) by mouth daily. (Patient not taking: Reported on 12/21/2017) 30 tablet 5   No facility-administered medications prior to visit.     ROS Review of Systems  Constitutional: Negative for activity change, appetite change and fatigue.  HENT: Negative for congestion, sinus pressure and sore throat.   Eyes: Negative for visual disturbance.  Respiratory: Negative for cough, chest  tightness, shortness of breath and wheezing.   Cardiovascular: Negative for chest pain and palpitations.  Gastrointestinal: Negative for abdominal distention, abdominal pain and constipation.  Endocrine: Negative for polydipsia.  Genitourinary: Negative for dysuria and frequency.  Musculoskeletal: Negative for arthralgias and back pain.  Skin: Negative for rash.  Neurological: Negative for tremors, light-headedness and numbness.  Hematological: Does not bruise/bleed easily.  Psychiatric/Behavioral: Negative for agitation and behavioral problems.    Objective:  BP 140/80   Pulse (!) 102   Temp 97.6 F (36.4 C) (Oral)   Ht '5\' 1"'  (1.549 m)   Wt 118 lb (53.5 kg)   SpO2 100%   BMI 22.30 kg/m   BP/Weight 07/03/2018 12/21/2017 6/38/7564  Systolic BP 332 951 884  Diastolic BP 80 80 84  Wt. (Lbs) 118 118 118.2  BMI 22.3 22.3 22.33     Physical Exam Constitutional:      Appearance: She is well-developed.  Cardiovascular:     Rate and Rhythm: Normal rate.     Heart sounds: Normal heart sounds. No murmur.  Pulmonary:     Effort: Pulmonary effort is normal.     Breath sounds: Normal breath sounds. No wheezing or rales.  Chest:     Chest wall: No tenderness.  Abdominal:     General: Bowel sounds are normal. There is no distension.     Palpations: Abdomen is soft. There is no mass.     Tenderness: There is no abdominal tenderness.  Musculoskeletal: Normal range of motion.  Neurological:  Mental Status: She is alert and oriented to person, place, and time.  Psychiatric:        Mood and Affect: Mood normal.        Behavior: Behavior normal.     CMP Latest Ref Rng & Units 09/05/2017 03/18/2017 07/13/2016  Glucose 65 - 99 mg/dL 115(H) 112(H) 120(H)  BUN 6 - 24 mg/dL '13 9 10  ' Creatinine 0.57 - 1.00 mg/dL 0.67 0.62 0.63  Sodium 134 - 144 mmol/L 139 138 143  Potassium 3.5 - 5.2 mmol/L 4.2 4.1 4.0  Chloride 96 - 106 mmol/L 103 98 104  CO2 20 - 29 mmol/L '21 22 27  ' Calcium 8.7 -  10.2 mg/dL 9.3 9.6 9.7  Total Protein 6.0 - 8.5 g/dL 7.9 8.0 8.1  Total Bilirubin 0.0 - 1.2 mg/dL 0.5 0.6 0.9  Alkaline Phos 39 - 117 IU/L 48 47 45  AST 0 - 40 IU/L '20 20 19  ' ALT 0 - 32 IU/L '26 17 17    ' Lipid Panel     Component Value Date/Time   CHOL 153 09/05/2017 0922   TRIG 73 09/05/2017 0922   HDL 63 09/05/2017 0922   CHOLHDL 2.4 09/05/2017 0922   CHOLHDL 2.4 07/13/2016 0936   VLDL 18 07/13/2016 0936   LDLCALC 75 09/05/2017 0922    Lab Results  Component Value Date   HGBA1C 5.7 (H) 01/12/2013     Assessment & Plan:   1. Pure hypercholesterolemia Controlled Low-cholesterol diet - Lipid panel - atorvastatin (LIPITOR) 40 MG tablet; Take 1 tablet (40 mg total) by mouth daily.  Dispense: 30 tablet; Refill: 6  2. Essential hypertension, benign Controlled Discontinue amlodipine due to dizziness experienced while taking it Home blood pressures in the low 737T systolic - GGY69+SWNI - lisinopril (PRINIVIL,ZESTRIL) 40 MG tablet; Take 1 tablet (40 mg total) by mouth daily.  Dispense: 30 tablet; Refill: 6  3. Screening for diabetes mellitus Diabetic diet to prevent development of diabetes mellitus - Hemoglobin A1c   Meds ordered this encounter  Medications  . atorvastatin (LIPITOR) 40 MG tablet    Sig: Take 1 tablet (40 mg total) by mouth daily.    Dispense:  30 tablet    Refill:  6  . lisinopril (PRINIVIL,ZESTRIL) 40 MG tablet    Sig: Take 1 tablet (40 mg total) by mouth daily.    Dispense:  30 tablet    Refill:  6    Follow-up: Return in about 6 months (around 01/01/2019) for Follow-up of chronic medical conditions.   Charlott Rakes MD

## 2018-07-04 ENCOUNTER — Other Ambulatory Visit: Payer: Self-pay | Admitting: Family Medicine

## 2018-07-04 LAB — HEMOGLOBIN A1C
Est. average glucose Bld gHb Est-mCnc: 146 mg/dL
HEMOGLOBIN A1C: 6.7 % — AB (ref 4.8–5.6)

## 2018-07-04 LAB — CMP14+EGFR
A/G RATIO: 1.8 (ref 1.2–2.2)
ALBUMIN: 5.3 g/dL — AB (ref 3.8–4.9)
ALK PHOS: 69 IU/L (ref 39–117)
ALT: 69 IU/L — ABNORMAL HIGH (ref 0–32)
AST: 39 IU/L (ref 0–40)
BUN/Creatinine Ratio: 15 (ref 9–23)
BUN: 9 mg/dL (ref 6–24)
Bilirubin Total: 0.9 mg/dL (ref 0.0–1.2)
CHLORIDE: 99 mmol/L (ref 96–106)
CO2: 16 mmol/L — ABNORMAL LOW (ref 20–29)
Calcium: 10.1 mg/dL (ref 8.7–10.2)
Creatinine, Ser: 0.6 mg/dL (ref 0.57–1.00)
GFR calc non Af Amer: 102 mL/min/{1.73_m2} (ref >59)
GFR, EST AFRICAN AMERICAN: 118 mL/min/{1.73_m2} (ref >59)
Globulin, Total: 2.9 g/dL (ref 1.5–4.5)
Glucose: 89 mg/dL (ref 65–99)
POTASSIUM: 4 mmol/L (ref 3.5–5.2)
SODIUM: 138 mmol/L (ref 134–144)
TOTAL PROTEIN: 8.2 g/dL (ref 6.0–8.5)

## 2018-07-04 LAB — LIPID PANEL
CHOL/HDL RATIO: 2.7 ratio (ref 0.0–4.4)
Cholesterol, Total: 178 mg/dL (ref 100–199)
HDL: 67 mg/dL (ref >39)
LDL Calculated: 87 mg/dL (ref 0–99)
TRIGLYCERIDES: 118 mg/dL (ref 0–149)
VLDL Cholesterol Cal: 24 mg/dL (ref 5–40)

## 2018-07-04 MED ORDER — METFORMIN HCL 500 MG PO TABS: 500.0000 mg | ORAL_TABLET | Freq: Two times a day (BID) | ORAL | 3 refills | Status: DC

## 2018-07-28 ENCOUNTER — Telehealth: Payer: Self-pay

## 2018-07-28 NOTE — Telephone Encounter (Signed)
Patient was called and informed of lab results via interpreter. Patient is scheduled to see luke on Monday for diabetic teaching and medication management.

## 2018-07-28 NOTE — Telephone Encounter (Signed)
-----   Message from Hoy Register, MD sent at 07/04/2018  6:43 PM EST ----- Labs reveal normal cholesterol. A1c is 6.7which gives a new diagnosis of Diabetes Mellitus. I have sent a rx for Metformin to her pharmacy.Please schedule with Franky Macho for Diabetic teaching.

## 2018-07-31 ENCOUNTER — Ambulatory Visit: Payer: Self-pay | Attending: Family Medicine | Admitting: Pharmacist

## 2018-07-31 DIAGNOSIS — Z131 Encounter for screening for diabetes mellitus: Secondary | ICD-10-CM

## 2018-07-31 LAB — GLUCOSE, POCT (MANUAL RESULT ENTRY): POC GLUCOSE: 131 mg/dL — AB (ref 70–99)

## 2018-07-31 MED ORDER — TRUE METRIX METER W/DEVICE KIT: PACK | 0 refills | Status: AC

## 2018-07-31 MED ORDER — TRUEPLUS LANCETS 28G MISC: 11 refills | Status: DC

## 2018-07-31 MED ORDER — GLUCOSE BLOOD VI STRP: ORAL_STRIP | 11 refills | Status: DC

## 2018-07-31 NOTE — Progress Notes (Signed)
    S:     No chief complaint on file.  Patient arrives in good spirits. Presents for diabetes evaluation, education, and management at the request of Dr. Alvis Lemmings. Patient was referred on 07/03/18 after her A1c came back at 6.7.   Pt with newly diagnosed T2DM (06/2018).   Family/Social History:  - Fhx: DM (brother) - Tobacco: Never smoker - Alcohol: denies current use  Insurance coverage/medication affordability: self-pay  Patient denies adherence with medications.  Current diabetes medications include: metformin - reports not taking d/t nausea, dizziness  Patient denies hypoglycemic events. She is not checking sugars at home.   Patient reported dietary habits:  - Switched to brown rice - Reports high intake of vegetables and fruits  - Before dx, she reports consuming a higher amount of sweets and sweetened beverages  Patient-reported exercise habits:  - 1 hour treadmill    Patient denies nocturia.  Patient denies neuropathy. Patient denies visual changes.  O:  POCT: 131 Not monitoring at home  Lab Results  Component Value Date   HGBA1C 6.7 (H) 07/03/2018   There were no vitals filed for this visit.  Lipid Panel     Component Value Date/Time   CHOL 178 07/03/2018 1533   TRIG 118 07/03/2018 1533   HDL 67 07/03/2018 1533   CHOLHDL 2.7 07/03/2018 1533   CHOLHDL 2.4 07/13/2016 0936   VLDL 18 07/13/2016 0936   LDLCALC 87 07/03/2018 1533   Clinical ASCVD: No  The 10-year ASCVD risk score Denman George DC Jr., et al., 2013) is: 2.6%   Values used to calculate the score:     Age: 58 years     Sex: Female     Is Non-Hispanic African American: No     Diabetic: No     Tobacco smoker: No     Systolic Blood Pressure: 140 mmHg     Is BP treated: Yes     HDL Cholesterol: 67 mg/dL     Total Cholesterol: 178 mg/dL   A/P: Diabetes newly diagnosed currently controlled with A1c <7. Patient is able to verbalize appropriate hypoglycemia management plan. Patient is not adherent  with medication - she is controlling with diet alone. Will send in for meter and supplies and have her return in 2 weeks with values for reassessment.   -Hold metformin for now.  -Patient was educated on the use of the True Metrix blood glucose meter.  -Instructions given to check fasting CBG every day with goal range of 80-130.  -Extensively discussed pathophysiology of DM, recommended lifestyle interventions, dietary effects on glycemic control -Counseled on s/sx of and management of hypoglycemia -Microalbumin -Next A1C anticipated 12/2018.   ASCVD risk - primary prevention in patient with DM. Last LDL is controlled. ASCVD risk score is not >20%  - she is tolerating atorvastatin well. -Continued atorvastatin 40 mg.   Written patient instructions provided.  Total time in face to face counseling 30 minutes.   Follow up pharmacist visit in 2 weeks.    Patient seen with: Arletha Pili, PharmD Candidate  [employer]  Class of 2022  Butch Penny, PharmD, CPP Clinical Pharmacist [facility] [phone]

## 2018-07-31 NOTE — Patient Instructions (Addendum)
C?m ?n b?n ? ??n g?p ti hm nay. Hy lm nh? sau:   1. Gi? metformin ngay by gi?.  2. Ki?m tra ???ng mu t?i nh. Ki?m tra tr??c khi ?n b?t c? th? g. M?c tiu c?a b?n l 80 ??n 130.  3. Ti?p t?c th?c hi?n cc thay ??i l?i s?ng m chng ti ? th?o lu?n cng nhau trong chuy?n th?m c?a chng ti. Ch? ?? ?n u?ng v t?p th? d?c ?ng m?t vai tr quan tr?ng trong vi?c c?i thi?n l??ng ???ng trong mu c?a b?n.  4. Theo di v?i ti trong 2 tu?n.   English:  Thank you for coming to see me today. Please do the following:  1. Hold metformin for now.  2. Check blood sugars at home. Check before eating anything. Your goal is 80 to 130.  3. Continue making the lifestyle changes we've discussed together during our visit. Diet and exercise play a significant role in improving your blood sugars.  4. Follow-up with me in 2 weeks.

## 2018-08-01 ENCOUNTER — Encounter: Payer: Self-pay | Admitting: Pharmacist

## 2018-08-01 LAB — MICROALBUMIN / CREATININE URINE RATIO
Creatinine, Urine: 20.5 mg/dL
Microalb/Creat Ratio: 17 mg/g creat (ref 0–29)
Microalbumin, Urine: 3.4 ug/mL

## 2018-08-14 ENCOUNTER — Ambulatory Visit: Payer: Self-pay | Attending: Family Medicine | Admitting: Pharmacist

## 2018-08-14 DIAGNOSIS — Z131 Encounter for screening for diabetes mellitus: Secondary | ICD-10-CM

## 2018-08-14 LAB — GLUCOSE, POCT (MANUAL RESULT ENTRY): POC Glucose: 83 mg/dl (ref 70–99)

## 2018-08-14 NOTE — Patient Instructions (Addendum)
Thank you for coming to see me today. Please do the following:  1. Continue holding metformin for now.  2. Continue checking blood sugars at home. Your goal range is 80 - 130.  3. Continue making the lifestyle changes we've discussed together during our visit. Diet and exercise play a significant role in improving your blood sugars.  4. Follow-up with Dr. Alvis Lemmings in May.  5. If you notice your blood sugar is above 130, call me. My number is [phone]. 6. When you need refills, call the pharmacy. Their number is [phone].   Vietnamese translation:  C?m ?n b?n ? ??n g?p ti hm nay. Hy lm nh? sau:   1. Ti?p t?c gi? metformin ngay by gi?.  2. Ti?p t?c ki?m tra ???ng mu t?i nh. Ph?m vi m?c tiu c?a b?n l 80 - 130.  3. Ti?p t?c th?c hi?n cc thay ??i l?i s?ng m chng ti ? th?o lu?n cng nhau trong chuy?n th?m c?a chng ti. Ch? ?? ?n u?ng v t?p th? d?c ?ng m?t vai tr quan tr?ng trong vi?c c?i thi?n l??ng ???ng trong mu c?a b?n.  4. Theo di v?i Ti?n s? [place] vo thng N?m. 5. N?u b?n nh?n th?y l??ng ???ng trong mu c?a b?n trn 130, hy g?i cho ti. S? c?a ti l [phone].  6. Khi b?n c?n n?p ti?n, hy g?i cho nh thu?c. S? c?a h? l [phone].

## 2018-08-14 NOTE — Progress Notes (Signed)
    S:     No chief complaint on file.  Patient arrives in good spirits. Presents for diabetes evaluation, education, and management at the request of Dr. Alvis Lemmings. Patient was referred on 07/03/18 after her A1c came back at 6.7.   Pt with newly diagnosed T2DM (06/2018).   Family/Social History:  - Fhx: DM (brother) - Tobacco: Never smoker - Alcohol: denies current use  Insurance coverage/medication affordability: self-pay  Patient denies adherence with medications.  Current diabetes medications include: metformin - not taking  Patient denies hypoglycemic events.    Patient reported dietary habits:  - Switched to brown rice - Reports high intake of vegetables and fruits  - Before dx, she reports consuming a higher amount of sweets and sweetened beverages  Patient-reported exercise habits:  - 1 hour treadmill    Patient denies nocturia.  Patient denies neuropathy. Patient denies visual changes.  O:  POCT: 83 Home fasting CBG: 95 - 125  Lab Results  Component Value Date   HGBA1C 6.7 (H) 07/03/2018   There were no vitals filed for this visit.  Lipid Panel     Component Value Date/Time   CHOL 178 07/03/2018 1533   TRIG 118 07/03/2018 1533   HDL 67 07/03/2018 1533   CHOLHDL 2.7 07/03/2018 1533   CHOLHDL 2.4 07/13/2016 0936   VLDL 18 07/13/2016 0936   LDLCALC 87 07/03/2018 1533   Clinical ASCVD: No  The 10-year ASCVD risk score Denman George DC Jr., et al., 2013) is: 2.6%   Values used to calculate the score:     Age: 57 years     Sex: Female     Is Non-Hispanic African American: No     Diabetic: No     Tobacco smoker: No     Systolic Blood Pressure: 140 mmHg     Is BP treated: Yes     HDL Cholesterol: 67 mg/dL     Total Cholesterol: 178 mg/dL   A/P: Diabetes newly diagnosed currently controlled with A1c <7. Patient is able to verbalize appropriate hypoglycemia management plan. Patient not taking metformin and has goal home levels. Commended her on control and  instructed her to continue to hold metformin.   -Hold metformin for now.  -Extensively discussed pathophysiology of DM, recommended lifestyle interventions, dietary effects on glycemic control -Counseled on s/sx of and management of hypoglycemia -Next A1C anticipated 12/2018.   ASCVD risk - primary prevention in patient with DM. Last LDL is controlled. ASCVD risk score is not >20%  - she is tolerating atorvastatin well. -Continued atorvastatin 40 mg.   Written patient instructions provided.  Total time in face to face counseling 30 minutes.   Follow up with PCP.    Butch Penny, PharmD, CPP Clinical Pharmacist [facility] [phone]

## 2018-08-15 ENCOUNTER — Encounter: Payer: Self-pay | Admitting: Pharmacist

## 2018-09-08 ENCOUNTER — Other Ambulatory Visit: Payer: Self-pay | Admitting: Family Medicine

## 2018-09-08 DIAGNOSIS — E7800 Pure hypercholesterolemia, unspecified: Secondary | ICD-10-CM

## 2019-03-27 ENCOUNTER — Other Ambulatory Visit: Payer: Self-pay | Admitting: Family Medicine

## 2019-03-27 DIAGNOSIS — I10 Essential (primary) hypertension: Secondary | ICD-10-CM

## 2019-04-23 ENCOUNTER — Other Ambulatory Visit: Payer: Self-pay | Admitting: Family Medicine

## 2019-04-23 DIAGNOSIS — I10 Essential (primary) hypertension: Secondary | ICD-10-CM

## 2019-04-27 ENCOUNTER — Other Ambulatory Visit: Payer: Self-pay | Admitting: Family Medicine

## 2019-04-27 DIAGNOSIS — I10 Essential (primary) hypertension: Secondary | ICD-10-CM

## 2019-05-24 ENCOUNTER — Other Ambulatory Visit: Payer: Self-pay | Admitting: Family Medicine

## 2019-05-24 DIAGNOSIS — I10 Essential (primary) hypertension: Secondary | ICD-10-CM

## 2019-06-19 ENCOUNTER — Other Ambulatory Visit: Payer: Self-pay

## 2019-06-19 ENCOUNTER — Ambulatory Visit: Payer: Self-pay | Attending: Family Medicine | Admitting: Family Medicine

## 2019-06-19 DIAGNOSIS — E1169 Type 2 diabetes mellitus with other specified complication: Secondary | ICD-10-CM

## 2019-06-19 DIAGNOSIS — E7800 Pure hypercholesterolemia, unspecified: Secondary | ICD-10-CM

## 2019-06-19 DIAGNOSIS — I10 Essential (primary) hypertension: Secondary | ICD-10-CM

## 2019-06-19 MED ORDER — ATORVASTATIN CALCIUM 40 MG PO TABS: 40.0000 mg | ORAL_TABLET | Freq: Every day | ORAL | 1 refills | Status: DC

## 2019-06-19 MED ORDER — TRUEPLUS LANCETS 28G MISC: 11 refills | Status: DC

## 2019-06-19 MED ORDER — TRUE METRIX BLOOD GLUCOSE TEST VI STRP: ORAL_STRIP | 11 refills | Status: AC

## 2019-06-19 MED ORDER — LISINOPRIL 40 MG PO TABS: 40.0000 mg | ORAL_TABLET | Freq: Every day | ORAL | 1 refills | Status: DC

## 2019-06-19 MED ORDER — METFORMIN HCL 500 MG PO TABS: 500.0000 mg | ORAL_TABLET | Freq: Two times a day (BID) | ORAL | 1 refills | Status: DC

## 2019-06-19 NOTE — Progress Notes (Signed)
Virtual Visit via Telephone Note  I connected with Amber Grant, on 06/19/2019 at 11:45 AM by telephone due to the COVID-19 pandemic and verified that I am speaking with the correct person using two identifiers.   Consent: I discussed the limitations, risks, security and privacy concerns of performing an evaluation and management service by telephone and the availability of in person appointments. I also discussed with the patient that there may be a patient responsible charge related to this service. The patient expressed understanding and agreed to proceed.   Location of Patient: Home  Location of Provider: Clinic   Persons participating in Telemedicine visit: [employer] Interpreter - [place] [id number] Dr. Margarita Rana     History of Present Illness: Amber Grant is a 59 year old female with a history of hypertension, hyperlipidemia who presents today accompanied by a family member for follow-up visit. She has not been going out since the pandemic began and is weary of coming for appointments.   She reports doing well. Her blood sugars at home and it was 110. Her systolic blood pressure is 120-125.  She endorses compliance with antihypertensive, her statin and Metformin. She has no chest pain, dyspnea and has no additional concerns.  Past Medical History:  Diagnosis Date  . Hypertension    No Known Allergies  Current Outpatient Medications on File Prior to Visit  Medication Sig Dispense Refill  . atorvastatin (LIPITOR) 40 MG tablet TAKE 1 TABLET BY MOUTH DAILY. 30 tablet 2  . Blood Glucose Monitoring Suppl (TRUE METRIX METER) w/Device KIT Use as directed to check blood sugar in the morning. 1 kit 0  . cholecalciferol (VITAMIN D) 1000 UNITS tablet Take 1,000 Units by mouth daily.    . fish oil-omega-3 fatty acids 1000 MG capsule Take 2 capsules (2 g total) by mouth daily. 90 capsule 3  . glucose blood (TRUE METRIX BLOOD GLUCOSE TEST) test strip Use as  instructed to check blood sugar in the morning. 100 each 11  . lisinopril (ZESTRIL) 40 MG tablet Take 1 tablet (40 mg total) by mouth daily. Must have office visit for refills 30 tablet 0  . metFORMIN (GLUCOPHAGE) 500 MG tablet Take 1 tablet (500 mg total) by mouth 2 (two) times daily with a meal. 60 tablet 3  . Multiple Vitamins-Minerals (MULTIVITAMIN PO) Take by mouth.    . TRUEPLUS LANCETS 28G MISC Use to check blood sugar once in the morning. 100 each 11   No current facility-administered medications on file prior to visit.    Observations/Objective: Alert, awake, oriented x3 Not in acute distress  Lab Results  Component Value Date   HGBA1C 6.7 (H) 07/03/2018    CMP Latest Ref Rng & Units 07/03/2018 09/05/2017 03/18/2017  Glucose 65 - 99 mg/dL 89 115(H) 112(H)  BUN 6 - 24 mg/dL '9 13 9  ' Creatinine 0.57 - 1.00 mg/dL 0.60 0.67 0.62  Sodium 134 - 144 mmol/L 138 139 138  Potassium 3.5 - 5.2 mmol/L 4.0 4.2 4.1  Chloride 96 - 106 mmol/L 99 103 98  CO2 20 - 29 mmol/L 16(L) 21 22  Calcium 8.7 - 10.2 mg/dL 10.1 9.3 9.6  Total Protein 6.0 - 8.5 g/dL 8.2 7.9 8.0  Total Bilirubin 0.0 - 1.2 mg/dL 0.9 0.5 0.6  Alkaline Phos 39 - 117 IU/L 69 48 47  AST 0 - 40 IU/L 39 20 20  ALT 0 - 32 IU/L 69(H) 26 17    Lipid Panel  Component Value Date/Time   CHOL 178 07/03/2018 1533   TRIG 118 07/03/2018 1533   HDL 67 07/03/2018 1533   CHOLHDL 2.7 07/03/2018 1533   CHOLHDL 2.4 07/13/2016 0936   VLDL 18 07/13/2016 0936   LDLCALC 87 07/03/2018 1533   LABVLDL 24 07/03/2018 1533    Assessment and Plan: 1. Essential hypertension, benign Controlled Counseled on blood pressure goal of less than 130/80, low-sodium, DASH diet, medication compliance, 150 minutes of moderate intensity exercise per week. Discussed medication compliance, adverse effects. - lisinopril (ZESTRIL) 40 MG tablet; Take 1 tablet (40 mg total) by mouth daily.  Dispense: 90 tablet; Refill: 1  2. Pure  hypercholesterolemia Controlled Counseled on low-cholesterol diet - atorvastatin (LIPITOR) 40 MG tablet; Take 1 tablet (40 mg total) by mouth daily.  Dispense: 90 tablet; Refill: 1  3. Type 2 diabetes mellitus with other specified complication, without long-term current use of insulin (HCC) Controlled with A1c of 6.7 She is due for labs and has been advised to come into the clinic for labs - Hemoglobin A1c; Future - Complete Metabolic Panel with GFR; Future - Lipid panel; Future - Microalbumin/Creatinine Ratio, Urine; Future - TRUEplus Lancets 28G MISC; Use to check blood sugar once in the morning.  Dispense: 100 each; Refill: 11 - glucose blood (TRUE METRIX BLOOD GLUCOSE TEST) test strip; Use as instructed to check blood sugar in the morning.  Dispense: 100 each; Refill: 11   Follow Up Instructions: Return for Fasting labs tomorrow morning; follow-up with PCP in 6 months.    I discussed the assessment and treatment plan with the patient. The patient was provided an opportunity to ask questions and all were answered. The patient agreed with the plan and demonstrated an understanding of the instructions.   The patient was advised to call back or seek an in-person evaluation if the symptoms worsen or if the condition fails to improve as anticipated.     I provided 10 minutes total of non-face-to-face time during this encounter including median intraservice time, reviewing previous notes, labs, imaging, medications, management and patient verbalized understanding.     Charlott Rakes, MD, FAAFP. [facility] and [place], [place]   06/19/2019, 11:45 AM

## 2019-06-19 NOTE — Progress Notes (Signed)
Patient has been called and DOB has been verified. Patient has been screened and transferred to PCP to start phone visit.     

## 2019-06-20 ENCOUNTER — Ambulatory Visit: Payer: Self-pay | Attending: Family Medicine

## 2019-06-20 DIAGNOSIS — E1169 Type 2 diabetes mellitus with other specified complication: Secondary | ICD-10-CM

## 2019-06-21 LAB — MICROALBUMIN / CREATININE URINE RATIO
Creatinine, Urine: 27.4 mg/dL
Microalb/Creat Ratio: <11 mg/g creat (ref 0–29)
Microalbumin, Urine: <3 ug/mL

## 2019-06-21 LAB — LIPID PANEL
Chol/HDL Ratio: 1.9 ratio (ref 0.0–4.4)
Cholesterol, Total: 128 mg/dL (ref 100–199)
HDL: 68 mg/dL (ref >39)
LDL Chol Calc (NIH): 47 mg/dL (ref 0–99)
Triglycerides: 63 mg/dL (ref 0–149)
VLDL Cholesterol Cal: 13 mg/dL (ref 5–40)

## 2019-06-21 LAB — CMP14+EGFR
ALT: 40 IU/L — ABNORMAL HIGH (ref 0–32)
AST: 31 IU/L (ref 0–40)
Albumin/Globulin Ratio: 2.1 (ref 1.2–2.2)
Albumin: 5 g/dL — ABNORMAL HIGH (ref 3.8–4.9)
Alkaline Phosphatase: 70 IU/L (ref 39–117)
BUN/Creatinine Ratio: 13 (ref 9–23)
BUN: 8 mg/dL (ref 6–24)
Bilirubin Total: 0.8 mg/dL (ref 0.0–1.2)
CO2: 22 mmol/L (ref 20–29)
Calcium: 9.7 mg/dL (ref 8.7–10.2)
Chloride: 96 mmol/L (ref 96–106)
Creatinine, Ser: 0.63 mg/dL (ref 0.57–1.00)
GFR calc Af Amer: 116 mL/min/{1.73_m2} (ref >59)
GFR calc non Af Amer: 101 mL/min/{1.73_m2} (ref >59)
Globulin, Total: 2.4 g/dL (ref 1.5–4.5)
Glucose: 126 mg/dL — ABNORMAL HIGH (ref 65–99)
Potassium: 4.3 mmol/L (ref 3.5–5.2)
Sodium: 133 mmol/L — ABNORMAL LOW (ref 134–144)
Total Protein: 7.4 g/dL (ref 6.0–8.5)

## 2019-06-21 LAB — HEMOGLOBIN A1C
Est. average glucose Bld gHb Est-mCnc: 117 mg/dL
Hgb A1c MFr Bld: 5.7 % — ABNORMAL HIGH (ref 4.8–5.6)

## 2019-06-29 ENCOUNTER — Encounter: Payer: Self-pay | Admitting: Family Medicine

## 2019-06-29 ENCOUNTER — Other Ambulatory Visit: Payer: Self-pay | Admitting: Family Medicine

## 2019-06-29 ENCOUNTER — Telehealth (INDEPENDENT_AMBULATORY_CARE_PROVIDER_SITE_OTHER): Payer: Self-pay

## 2019-06-29 NOTE — Telephone Encounter (Signed)
Late documentation.  Patient was given results by CMA bien YY.

## 2019-06-29 NOTE — Telephone Encounter (Signed)
-----   Message from Hoy Register, MD sent at 06/21/2019  2:22 PM EST ----- Please inform the patient that labs are normal. Thank you.

## 2019-11-07 ENCOUNTER — Other Ambulatory Visit: Payer: Self-pay

## 2019-11-07 ENCOUNTER — Ambulatory Visit: Payer: Self-pay | Attending: Family Medicine

## 2019-11-27 ENCOUNTER — Telehealth: Payer: Self-pay | Admitting: Family Medicine

## 2019-11-27 NOTE — Telephone Encounter (Signed)
Pt was sent a letter from financial dept. Inform them, that the application they submitted was incomplete, since they were missing some documentation at the time of the appointment, Pt need to reschedule and resubmit all new papers and application for CAFA and OC, P.S. old documents has been sent back by mail to the Pt and Pt. need to make a new appt. 

## 2019-12-05 ENCOUNTER — Encounter: Payer: Self-pay | Admitting: Family Medicine

## 2019-12-05 ENCOUNTER — Other Ambulatory Visit: Payer: Self-pay

## 2019-12-05 ENCOUNTER — Ambulatory Visit: Payer: Self-pay | Attending: Family Medicine | Admitting: Family Medicine

## 2019-12-05 VITALS — BP 154/76 | HR 93 | Ht 61.0 in | Wt 107.6 lb

## 2019-12-05 DIAGNOSIS — Z1211 Encounter for screening for malignant neoplasm of colon: Secondary | ICD-10-CM

## 2019-12-05 DIAGNOSIS — I10 Essential (primary) hypertension: Secondary | ICD-10-CM

## 2019-12-05 DIAGNOSIS — E7800 Pure hypercholesterolemia, unspecified: Secondary | ICD-10-CM

## 2019-12-05 DIAGNOSIS — Z1231 Encounter for screening mammogram for malignant neoplasm of breast: Secondary | ICD-10-CM

## 2019-12-05 MED ORDER — LISINOPRIL 40 MG PO TABS: 40.0000 mg | ORAL_TABLET | Freq: Every day | ORAL | 1 refills | Status: DC

## 2019-12-05 MED ORDER — ATORVASTATIN CALCIUM 40 MG PO TABS: 40.0000 mg | ORAL_TABLET | Freq: Every day | ORAL | 1 refills | Status: DC

## 2019-12-05 NOTE — Patient Instructions (Signed)

## 2019-12-05 NOTE — Progress Notes (Signed)
Subjective:  Patient ID: Amber Grant, female    DOB: 03/16/1961  Age: 59 y.o. MRN: [id number]  CC: Hypertension   HPI Amber Grant is a 59 year old female with a history of hypertension, hyperlipidemia who presents today for chronic disease management. BP at home has been 579 systolic and she is surprised her blood pressure is elevated at 154/76 as she sometimes wonders her blood pressure may be too low at home.  She endorses compliance with her antihypertensive and her statin. She is accompanied by an interpreter today and has no additional concerns but would like to hold off on labs today due to not having insurance and having to pay the cost for her labs.  Past Medical History:  Diagnosis Date  . Hypertension     History reviewed. No pertinent surgical history.  Family History  Problem Relation Age of Onset  . Diabetes Brother     No Known Allergies  Outpatient Medications Prior to Visit  Medication Sig Dispense Refill  . Blood Glucose Monitoring Suppl (TRUE METRIX METER) w/Device KIT Use as directed to check blood sugar in the morning. 1 kit 0  . cholecalciferol (VITAMIN D) 1000 UNITS tablet Take 1,000 Units by mouth daily.    . fish oil-omega-3 fatty acids 1000 MG capsule Take 2 capsules (2 g total) by mouth daily. 90 capsule 3  . glucose blood (TRUE METRIX BLOOD GLUCOSE TEST) test strip Use as instructed to check blood sugar in the morning. 100 each 11  . Multiple Vitamins-Minerals (MULTIVITAMIN PO) Take by mouth.    . TRUEplus Lancets 28G MISC Use to check blood sugar once in the morning. 100 each 11  . atorvastatin (LIPITOR) 40 MG tablet Take 1 tablet (40 mg total) by mouth daily. 90 tablet 1  . lisinopril (ZESTRIL) 40 MG tablet Take 1 tablet (40 mg total) by mouth daily. 90 tablet 1   No facility-administered medications prior to visit.     ROS Review of Systems General: negative for fever, weight loss, appetite change Eyes: no visual symptoms. ENT: no ear  symptoms, no sinus tenderness, no nasal congestion or sore throat. Neck: no pain  Respiratory: no wheezing, shortness of breath, cough Cardiovascular: no chest pain, no dyspnea on exertion, no pedal edema, no orthopnea. Gastrointestinal: no abdominal pain, no diarrhea, no constipation Genito-Urinary: no urinary frequency, no dysuria, no polyuria. Hematologic: no bruising Endocrine: no cold or heat intolerance Neurological: no headaches, no seizures, no tremors Musculoskeletal: no joint pains, no joint swelling Skin: no pruritus, no rash. Psychological: no depression, no anxiety,    Objective:  BP (!) 154/76   Pulse 93   Ht '5\' 1"'  (1.549 m)   Wt 107 lb 9.6 oz (48.8 kg)   SpO2 100%   BMI 20.33 kg/m   BP/Weight 12/05/2019 07/03/2018 0/38/3338  Systolic BP 329 191 660  Diastolic BP 76 80 80  Wt. (Lbs) 107.6 118 118  BMI 20.33 22.3 22.3      Physical Exam Constitutional: normal appearing,  Eyes: PERRLA HEENT: Head is atraumatic, normal sinuses, normal oropharynx, normal appearing tonsils and palate, tympanic membrane is normal bilaterally. Neck: normal range of motion, no thyromegaly, no JVD Cardiovascular: normal rate and rhythm, normal heart sounds, no murmurs, rub or gallop, no pedal edema Respiratory: Normal breath sounds, clear to auscultation bilaterally, no wheezes, no rales, no rhonchi Abdomen: soft, not tender to palpation, normal bowel sounds, no enlarged organs Musculoskeletal: Full ROM, no tenderness in joints Skin: warm and dry, no  lesions. Neurological: alert, oriented x3, cranial nerves I-XII grossly intact , normal motor strength, normal sensation. Psychological: normal mood.   CMP Latest Ref Rng & Units 06/20/2019 07/03/2018 09/05/2017  Glucose 65 - 99 mg/dL 126(H) 89 115(H)  BUN 6 - 24 mg/dL '8 9 13  ' Creatinine 0.57 - 1.00 mg/dL 0.63 0.60 0.67  Sodium 134 - 144 mmol/L 133(L) 138 139  Potassium 3.5 - 5.2 mmol/L 4.3 4.0 4.2  Chloride 96 - 106 mmol/L 96 99 103    CO2 20 - 29 mmol/L 22 16(L) 21  Calcium 8.7 - 10.2 mg/dL 9.7 10.1 9.3  Total Protein 6.0 - 8.5 g/dL 7.4 8.2 7.9  Total Bilirubin 0.0 - 1.2 mg/dL 0.8 0.9 0.5  Alkaline Phos 39 - 117 IU/L 70 69 48  AST 0 - 40 IU/L 31 39 20  ALT 0 - 32 IU/L 40(H) 69(H) 26    Lipid Panel     Component Value Date/Time   CHOL 128 06/20/2019 1003   TRIG 63 06/20/2019 1003   HDL 68 06/20/2019 1003   CHOLHDL 1.9 06/20/2019 1003   CHOLHDL 2.4 07/13/2016 0936   VLDL 18 07/13/2016 0936   LDLCALC 47 06/20/2019 1003    CBC    Component Value Date/Time   WBC 4.8 07/13/2016 0936   RBC 4.49 07/13/2016 0936   HGB 13.7 07/13/2016 0936   HCT 41.5 07/13/2016 0936   PLT 292 07/13/2016 0936   MCV 92.4 07/13/2016 0936   MCH 30.5 07/13/2016 0936   MCHC 33.0 07/13/2016 0936   RDW 13.2 07/13/2016 0936   LYMPHSABS 1,920 07/13/2016 0936   MONOABS 192 (L) 07/13/2016 0936   EOSABS 192 07/13/2016 0936   BASOSABS 48 07/13/2016 0936    Lab Results  Component Value Date   HGBA1C 5.7 (H) 06/20/2019    Assessment & Plan:  1. Essential hypertension, benign Uncontrolled; I repeated blood pressure manually and B.  155/80 Ambulatory blood pressures have been controlled hence I will make no regimen changes to prevent hypotension She could have a component of whitecoat hypertension Counseled on blood pressure goal of less than 130/80, low-sodium, DASH diet, medication compliance, 150 minutes of moderate intensity exercise per week. Discussed medication compliance, adverse effects. - lisinopril (ZESTRIL) 40 MG tablet; Take 1 tablet (40 mg total) by mouth daily.  Dispense: 90 tablet; Refill: 1  2. Colon cancer screening - Fecal occult blood, imunochemical([facility])  3. Encounter for screening mammogram for malignant neoplasm of breast - MM DIGITAL SCREENING BILATERAL; Future  4. Pure hypercholesterolemia Controlled Low-cholesterol diet - atorvastatin (LIPITOR) 40 MG tablet; Take 1 tablet (40 mg total) by  mouth daily.  Dispense: 90 tablet; Refill: 1  Meds ordered this encounter  Medications  . lisinopril (ZESTRIL) 40 MG tablet    Sig: Take 1 tablet (40 mg total) by mouth daily.    Dispense:  90 tablet    Refill:  1    Please dispense #90  . atorvastatin (LIPITOR) 40 MG tablet    Sig: Take 1 tablet (40 mg total) by mouth daily.    Dispense:  90 tablet    Refill:  1    Please dispense #90    Follow-up: Return in about 6 months (around 06/05/2020) for Chronic disease management.       Charlott Rakes, MD, FAAFP. [facility] and [facility], [place]   12/05/2019, 4:35 PM

## 2020-03-19 ENCOUNTER — Ambulatory Visit: Payer: Self-pay | Attending: Family Medicine

## 2020-03-19 ENCOUNTER — Other Ambulatory Visit: Payer: Self-pay

## 2020-06-04 ENCOUNTER — Other Ambulatory Visit: Payer: Self-pay | Admitting: Family Medicine

## 2020-06-04 DIAGNOSIS — I10 Essential (primary) hypertension: Secondary | ICD-10-CM

## 2020-06-17 ENCOUNTER — Telehealth: Payer: Self-pay | Admitting: Family Medicine

## 2020-06-17 ENCOUNTER — Other Ambulatory Visit: Payer: Self-pay | Admitting: Family Medicine

## 2020-06-17 DIAGNOSIS — E7800 Pure hypercholesterolemia, unspecified: Secondary | ICD-10-CM

## 2020-06-17 MED ORDER — ATORVASTATIN CALCIUM 40 MG PO TABS: 40.0000 mg | ORAL_TABLET | Freq: Every day | ORAL | 0 refills | Status: DC

## 2020-06-17 NOTE — Telephone Encounter (Signed)
Rx sent 

## 2020-06-17 NOTE — Telephone Encounter (Signed)
1) Medication(s) Requested (by name): atorvastatin (LIPITOR) 40 MG tablet   2) Pharmacy of Choice: [facility]    3) Special Requests:   Approved medications will be sent to the pharmacy, we will reach out if there is an issue.  Requests made after 3pm may not be addressed until the following business day!  If a patient is unsure of the name of the medication(s) please note and ask patient to call back when they are able to provide all info, do not send to responsible party until all information is available!

## 2020-07-08 ENCOUNTER — Other Ambulatory Visit: Payer: Self-pay | Admitting: Family Medicine

## 2020-07-08 ENCOUNTER — Encounter: Payer: Self-pay | Admitting: Family Medicine

## 2020-07-08 ENCOUNTER — Other Ambulatory Visit: Payer: Self-pay

## 2020-07-08 ENCOUNTER — Ambulatory Visit: Payer: Self-pay | Attending: Family Medicine | Admitting: Family Medicine

## 2020-07-08 VITALS — BP 166/77 | HR 102 | Ht 61.0 in | Wt 109.0 lb

## 2020-07-08 DIAGNOSIS — I10 Essential (primary) hypertension: Secondary | ICD-10-CM

## 2020-07-08 DIAGNOSIS — Z0000 Encounter for general adult medical examination without abnormal findings: Secondary | ICD-10-CM

## 2020-07-08 DIAGNOSIS — E7800 Pure hypercholesterolemia, unspecified: Secondary | ICD-10-CM

## 2020-07-08 DIAGNOSIS — Z124 Encounter for screening for malignant neoplasm of cervix: Secondary | ICD-10-CM

## 2020-07-08 DIAGNOSIS — Z1211 Encounter for screening for malignant neoplasm of colon: Secondary | ICD-10-CM

## 2020-07-08 DIAGNOSIS — Z1231 Encounter for screening mammogram for malignant neoplasm of breast: Secondary | ICD-10-CM

## 2020-07-08 MED ORDER — LISINOPRIL 40 MG PO TABS: 40.0000 mg | ORAL_TABLET | Freq: Every day | ORAL | 1 refills | Status: DC

## 2020-07-08 MED ORDER — ATORVASTATIN CALCIUM 40 MG PO TABS: 40.0000 mg | ORAL_TABLET | Freq: Every day | ORAL | 1 refills | Status: DC

## 2020-07-08 NOTE — Progress Notes (Signed)
Subjective:  Patient ID: Amber Grant, female    DOB: June 15, 1960  Age: 60 y.o. MRN: [id number]  CC: Annual Exam and Gynecologic Exam   HPI Amber Grant is a 60 year old female with a history of hypertension, hyperlipidemia who presents today for a gynecological exam. She is due for Pap smear, mammogram and colonoscopy. Her blood pressure is elevated and she informs me her systolic blood pressure ranges from 118-130 at home and she endorses taking her antihypertensive today.  Past Medical History:  Diagnosis Date  . Hypertension     History reviewed. No pertinent surgical history.  Family History  Problem Relation Age of Onset  . Diabetes Brother     No Known Allergies  Outpatient Medications Prior to Visit  Medication Sig Dispense Refill  . Blood Glucose Monitoring Suppl (TRUE METRIX METER) w/Device KIT Use as directed to check blood sugar in the morning. 1 kit 0  . cholecalciferol (VITAMIN D) 1000 UNITS tablet Take 1,000 Units by mouth daily.    . fish oil-omega-3 fatty acids 1000 MG capsule Take 2 capsules (2 g total) by mouth daily. 90 capsule 3  . glucose blood (TRUE METRIX BLOOD GLUCOSE TEST) test strip Use as instructed to check blood sugar in the morning. 100 each 11  . Multiple Vitamins-Minerals (MULTIVITAMIN PO) Take by mouth.    . TRUEplus Lancets 28G MISC Use to check blood sugar once in the morning. 100 each 11  . atorvastatin (LIPITOR) 40 MG tablet Take 1 tablet (40 mg total) by mouth daily. 30 tablet 0  . lisinopril (ZESTRIL) 40 MG tablet TAKE 1 TABLET (40 MG TOTAL) BY MOUTH DAILY. 30 tablet 0   No facility-administered medications prior to visit.     ROS Review of Systems  Constitutional: Negative for activity change, appetite change and fatigue.  HENT: Negative for congestion, sinus pressure and sore throat.   Eyes: Negative for visual disturbance.  Respiratory: Negative for cough, chest tightness, shortness of breath and wheezing.   Cardiovascular:  Negative for chest pain and palpitations.  Gastrointestinal: Negative for abdominal distention, abdominal pain and constipation.  Endocrine: Negative for polydipsia.  Genitourinary: Negative for dysuria and frequency.  Musculoskeletal: Negative for arthralgias and back pain.  Skin: Negative for rash.  Neurological: Negative for tremors, light-headedness and numbness.  Hematological: Does not bruise/bleed easily.  Psychiatric/Behavioral: Negative for agitation and behavioral problems.    Objective:  BP (!) 166/77   Pulse (!) 102   Ht _0  (1.549 m)   Wt 109 lb (49.4 kg)   SpO2 100%   BMI 20.60 kg/m   BP/Weight 07/08/2020 12/05/2019 1/59/4585  Systolic BP 929 244 628  Diastolic BP 77 76 80  Wt. (Lbs) 109 107.6 118  BMI 20.6 20.33 22.3      Physical Exam Constitutional:      General: She is not in acute distress.    Appearance: She is well-developed and well-nourished. She is not diaphoretic.  HENT:     Head: Normocephalic.     Right Ear: External ear normal.     Left Ear: External ear normal.     Nose: Nose normal.     Mouth/Throat:     Mouth: Oropharynx is clear and moist.  Eyes:     Extraocular Movements: EOM normal.     Conjunctiva/sclera: Conjunctivae normal.     Pupils: Pupils are equal, round, and reactive to light.  Neck:     Vascular: No JVD.  Cardiovascular:  Rate and Rhythm: Normal rate and regular rhythm.     Pulses: Intact distal pulses.     Heart sounds: Normal heart sounds. No murmur heard. No gallop.   Pulmonary:     Effort: Pulmonary effort is normal. No respiratory distress.     Breath sounds: Normal breath sounds. No wheezing or rales.  Chest:     Chest wall: No tenderness.  Breasts:     Right: No mass or tenderness.     Left: No mass or tenderness.    Abdominal:     General: Bowel sounds are normal. There is no distension.     Palpations: Abdomen is soft. There is no mass.     Tenderness: There is no abdominal tenderness.   Genitourinary:    Comments: External genitalia, vagina, cervix, adnexa-normal Musculoskeletal:        General: No tenderness or edema. Normal range of motion.     Cervical back: Normal range of motion.  Skin:    General: Skin is warm and dry.  Neurological:     Mental Status: She is alert and oriented to person, place, and time.     Deep Tendon Reflexes: Reflexes are normal and symmetric.  Psychiatric:        Mood and Affect: Mood and affect normal.     CMP Latest Ref Rng & Units 06/20/2019 07/03/2018 09/05/2017  Glucose 65 - 99 mg/dL 126(H) 89 115(H)  BUN 6 - 24 mg/dL _0 Creatinine 0.57 - 1.00 mg/dL 0.63 0.60 0.67  Sodium 134 - 144 mmol/L 133(L) 138 139  Potassium 3.5 - 5.2 mmol/L 4.3 4.0 4.2  Chloride 96 - 106 mmol/L 96 99 103  CO2 20 - 29 mmol/L 22 16(L) 21  Calcium 8.7 - 10.2 mg/dL 9.7 10.1 9.3  Total Protein 6.0 - 8.5 g/dL 7.4 8.2 7.9  Total Bilirubin 0.0 - 1.2 mg/dL 0.8 0.9 0.5  Alkaline Phos 39 - 117 IU/L 70 69 48  AST 0 - 40 IU/L 31 39 20  ALT 0 - 32 IU/L 40(H) 69(H) 26    Lipid Panel     Component Value Date/Time   CHOL 128 06/20/2019 1003   TRIG 63 06/20/2019 1003   HDL 68 06/20/2019 1003   CHOLHDL 1.9 06/20/2019 1003   CHOLHDL 2.4 07/13/2016 0936   VLDL 18 07/13/2016 0936   LDLCALC 47 06/20/2019 1003    CBC    Component Value Date/Time   WBC 4.8 07/13/2016 0936   RBC 4.49 07/13/2016 0936   HGB 13.7 07/13/2016 0936   HCT 41.5 07/13/2016 0936   PLT 292 07/13/2016 0936   MCV 92.4 07/13/2016 0936   MCH 30.5 07/13/2016 0936   MCHC 33.0 07/13/2016 0936   RDW 13.2 07/13/2016 0936   LYMPHSABS 1,920 07/13/2016 0936   MONOABS 192 (L) 07/13/2016 0936   EOSABS 192 07/13/2016 0936   BASOSABS 48 07/13/2016 0936    Lab Results  Component Value Date   HGBA1C 5.7 (H) 06/20/2019    Assessment & Plan:  1. Annual physical exam Counseled on 150 minutes of exercise per week, healthy eating (including decreased daily intake of saturated fats, cholesterol,  added sugars, sodium), STI prevention, routine healthcare maintenance. - CMP14+EGFR - Lipid panel  2. Screening for cervical cancer - Cytology - PAP([place])  3. Encounter for screening mammogram for malignant neoplasm of breast - MM 3D SCREEN BREAST BILATERAL; Future  4. Screening for colon cancer - Fecal occult blood, imunochemical([facility])  5. Pure  hypercholesterolemia Controlled - atorvastatin (LIPITOR) 40 MG tablet; Take 1 tablet (40 mg total) by mouth daily.  Dispense: 90 tablet; Refill: 1  6. Essential hypertension, benign Uncontrolled ?  [place] hypertension We will have her follow-up with with the Pharm.D. and if blood pressure is still elevated consider switching to lisinopril/HCTZ 20/12.44m 2 tablets daily Counseled on blood pressure goal of less than 130/80, low-sodium, DASH diet, medication compliance, 150 minutes of moderate intensity exercise per week. Discussed medication compliance, adverse effects. - lisinopril (ZESTRIL) 40 MG tablet; Take 1 tablet (40 mg total) by mouth daily.  Dispense: 90 tablet; Refill: 1   Meds ordered this encounter  Medications  . atorvastatin (LIPITOR) 40 MG tablet    Sig: Take 1 tablet (40 mg total) by mouth daily.    Dispense:  90 tablet    Refill:  1  . lisinopril (ZESTRIL) 40 MG tablet    Sig: Take 1 tablet (40 mg total) by mouth daily.    Dispense:  90 tablet    Refill:  1    Enough until appt    Follow-up: Return in about 1 week (around 07/15/2020) [place], PCP 6 months.       ECharlott Rakes MD, FAAFP. [facility] [place]  07/08/2020, 11:08 AM

## 2020-07-09 LAB — CMP14+EGFR
ALT: 28 IU/L (ref 0–32)
AST: 27 IU/L (ref 0–40)
Albumin/Globulin Ratio: 1.7 (ref 1.2–2.2)
Albumin: 4.8 g/dL (ref 3.8–4.9)
Alkaline Phosphatase: 56 IU/L (ref 44–121)
BUN/Creatinine Ratio: 17 (ref 9–23)
BUN: 11 mg/dL (ref 6–24)
Bilirubin Total: 0.9 mg/dL (ref 0.0–1.2)
CO2: 19 mmol/L — ABNORMAL LOW (ref 20–29)
Calcium: 9.5 mg/dL (ref 8.7–10.2)
Chloride: 96 mmol/L (ref 96–106)
Creatinine, Ser: 0.65 mg/dL (ref 0.57–1.00)
GFR calc Af Amer: 113 mL/min/{1.73_m2} (ref >59)
GFR calc non Af Amer: 98 mL/min/{1.73_m2} (ref >59)
Globulin, Total: 2.9 g/dL (ref 1.5–4.5)
Glucose: 114 mg/dL — ABNORMAL HIGH (ref 65–99)
Potassium: 4.3 mmol/L (ref 3.5–5.2)
Sodium: 135 mmol/L (ref 134–144)
Total Protein: 7.7 g/dL (ref 6.0–8.5)

## 2020-07-09 LAB — LIPID PANEL
Chol/HDL Ratio: 2.2 ratio (ref 0.0–4.4)
Cholesterol, Total: 149 mg/dL (ref 100–199)
HDL: 68 mg/dL (ref >39)
LDL Chol Calc (NIH): 69 mg/dL (ref 0–99)
Triglycerides: 60 mg/dL (ref 0–149)
VLDL Cholesterol Cal: 12 mg/dL (ref 5–40)

## 2020-07-11 LAB — CYTOLOGY - PAP
Comment: NEGATIVE
Diagnosis: NEGATIVE
High risk HPV: NEGATIVE

## 2020-07-15 ENCOUNTER — Ambulatory Visit: Payer: Medicaid Other | Admitting: Pharmacist

## 2020-07-17 ENCOUNTER — Other Ambulatory Visit: Payer: Self-pay

## 2020-07-17 ENCOUNTER — Ambulatory Visit: Payer: Self-pay | Attending: Family Medicine | Admitting: Pharmacist

## 2020-07-17 ENCOUNTER — Encounter: Payer: Self-pay | Admitting: Pharmacist

## 2020-07-17 VITALS — BP 167/83 | HR 106

## 2020-07-17 DIAGNOSIS — I10 Essential (primary) hypertension: Secondary | ICD-10-CM

## 2020-07-17 NOTE — Progress Notes (Signed)
   S:    PCP: Dr. Alvis Lemmings   Patient arrives in good spirits. Presents to the clinic for hypertension evaluation, counseling, and management. Patient was referred and last seen by Primary Care Provider on 07/08/2020.    Patient reports doing well today. Denies chest pain, dyspnea, HA or blurred vision.   Medication adherence reported. She takes her medication first thing in the morning and then takes her BP immediately following this.   Current BP Medications include:  Lisinopril 40 mg daily  Dietary habits include: non-compliant with salt restriction; drinks coffee daily  Exercise habits include: walks 1-2 hours daily  Family / Social history:  FHx: DM (brother) Tobacco: never smoker  Alcohol: denies use    O:  Vitals:   07/17/20 1123  BP: (!) 167/83  Pulse: (!) 106   Home BP readings:  Brings her BP monitor with stored memory.  SBP range: 112 - 133 DBP range: 68 - 79  Last 3 Office BP readings: BP Readings from Last 3 Encounters:  07/17/20 (!) 167/83  07/08/20 (!) 166/77  12/05/19 (!) 154/76   BMET    Component Value Date/Time   NA 135 07/08/2020 1049   K 4.3 07/08/2020 1049   CL 96 07/08/2020 1049   CO2 19 (L) 07/08/2020 1049   GLUCOSE 114 (H) 07/08/2020 1049   GLUCOSE 120 (H) 07/13/2016 0936   BUN 11 07/08/2020 1049   CREATININE 0.65 07/08/2020 1049   CREATININE 0.63 07/13/2016 0936   CALCIUM 9.5 07/08/2020 1049   GFRNONAA 98 07/08/2020 1049   GFRNONAA >89 07/13/2016 0936   GFRAA 113 07/08/2020 1049   GFRAA >89 07/13/2016 0936    Renal function: Estimated Creatinine Clearance: 57.8 mL/min (by C-G formula based on SCr of 0.65 mg/dL).  Clinical ASCVD: No  The 10-year ASCVD risk score Denman George DC Jr., et al., 2013) is: 7.6%   Values used to calculate the score:     Age: 60 years     Sex: Female     Is Non-Hispanic African American: No     Diabetic: Yes     Tobacco smoker: No     Systolic Blood Pressure: 167 mmHg     Is BP treated: Yes     HDL  Cholesterol: 68 mg/dL     Total Cholesterol: 149 mg/dL   A/P: Hypertension longstanding currently above goal on current medications. BP Goal = < 130/80 mmHg. Medication adherence reported. Her blood pressures at home are mostly at goal. In addition to her BP, her HR is a little elevated today - I questioned pt about anxiety and she reports that she's always very nervous in clinic. Given this, I think it is reasonable to hold off on medication changes today and see her back in 1 month. -Continued current medications.  -Counseled on lifestyle modifications for blood pressure control including reduced dietary sodium, increased exercise, adequate sleep.  Results reviewed and written information provided.   Total time in face-to-face counseling 15 minutes.   F/U Clinic Visit in 1 month.   Patient seen with:  Adam Phenix, PharmD Candidate  [employer]  Class of 2024   Butch Penny, PharmD, [place], CPP Clinical Pharmacist [facility] [phone]

## 2020-07-18 ENCOUNTER — Telehealth: Payer: Self-pay

## 2020-07-18 LAB — FECAL OCCULT BLOOD, IMMUNOCHEMICAL: Fecal Occult Bld: NEGATIVE

## 2020-07-18 NOTE — Telephone Encounter (Signed)
Pt has viewed results via mychart. 

## 2020-07-18 NOTE — Telephone Encounter (Signed)
-----   Message from Hoy Register, MD sent at 07/11/2020  4:58 PM EST ----- Pap smear is normal

## 2020-08-04 ENCOUNTER — Telehealth: Payer: Self-pay

## 2020-08-04 NOTE — Telephone Encounter (Signed)
-----   Message from Enobong Newlin, MD sent at 07/20/2020  2:11 PM EST ----- Stool test which is a screen for colon cancer is negative 

## 2020-08-04 NOTE — Telephone Encounter (Signed)
Patient has viewed results via mychart on 07/23/20

## 2020-08-07 NOTE — Telephone Encounter (Signed)
Patient has viewed results via mychart on 07/23/20  

## 2020-08-07 NOTE — Telephone Encounter (Signed)
-----   Message from Hoy Register, MD sent at 07/20/2020  2:11 PM EST ----- Stool test which is a screen for colon cancer is negative

## 2020-08-14 ENCOUNTER — Ambulatory Visit: Payer: Self-pay | Attending: Family Medicine | Admitting: Pharmacist

## 2020-08-14 ENCOUNTER — Encounter: Payer: Self-pay | Admitting: Pharmacist

## 2020-08-14 ENCOUNTER — Other Ambulatory Visit: Payer: Self-pay

## 2020-08-14 VITALS — BP 125/86

## 2020-08-14 DIAGNOSIS — I10 Essential (primary) hypertension: Secondary | ICD-10-CM

## 2020-08-14 NOTE — Progress Notes (Signed)
    S:    PCP: Dr. Alvis Lemmings   Patient arrives in good spirits. Presents to the clinic for hypertension evaluation, counseling, and management. Patient was referred and last seen by Primary Care Provider on 07/08/2020.  I saw her on 07/17/2020.   Patient reports doing well today. Denies chest pain, dyspnea, HA or blurred vision.   Medication adherence reported.   Current BP Medications include:  Lisinopril 40 mg daily  Dietary habits include: not fully compliant with salt restriction; drinks coffee daily  Exercise habits include: walks 1-2 hours daily  Family / Social history:  FHx: DM (brother) Tobacco: never smoker  Alcohol: denies use   O:  Vitals:   08/14/20 1709  BP: 125/86    Home BP readings:  BP this AM at home: 125/86, 84  08/13/2020: 130/78, 76  08/12/2020: 128/75, 77 08/11/2020: 128/71, 73 08/10/2020: 128/75, 78  08/09/2020: 131/68, 80 08/08/2020: 117/73, 71 08/07/2020: 129/80, 76  Of note, since last visit patient denies SBP readings >140 mmHg Of note, she reports her highest DBP was 86 mmHg Avg in the last week: 127/74 mmHg  Last 3 Office BP readings: BP Readings from Last 3 Encounters:  08/14/20 125/86  07/17/20 (!) 167/83  07/08/20 (!) 166/77   BMET    Component Value Date/Time   NA 135 07/08/2020 1049   K 4.3 07/08/2020 1049   CL 96 07/08/2020 1049   CO2 19 (L) 07/08/2020 1049   GLUCOSE 114 (H) 07/08/2020 1049   GLUCOSE 120 (H) 07/13/2016 0936   BUN 11 07/08/2020 1049   CREATININE 0.65 07/08/2020 1049   CREATININE 0.63 07/13/2016 0936   CALCIUM 9.5 07/08/2020 1049   GFRNONAA 98 07/08/2020 1049   GFRNONAA >89 07/13/2016 0936   GFRAA 113 07/08/2020 1049   GFRAA >89 07/13/2016 0936    Renal function: CrCl cannot be calculated (Patient's most recent lab result is older than the maximum 21 days allowed.).  Clinical ASCVD: No  The 10-year ASCVD risk score Denman George DC Jr., et al., 2013) is: 4.3%   Values used to calculate the score:     Age: 60 years      Sex: Female     Is Non-Hispanic African American: No     Diabetic: Yes     Tobacco smoker: No     Systolic Blood Pressure: 125 mmHg     Is BP treated: Yes     HDL Cholesterol: 68 mg/dL     Total Cholesterol: 149 mg/dL   A/P: Hypertension longstanding currently at goal based on current medications. BP Goal = < 130/80 mmHg. Medication adherence reported. Her blood pressures at home are controlled. I do think her above goal readings in clinic are due to white coat HTN. -Continued current medications.  -Counseled on lifestyle modifications for blood pressure control including reduced dietary sodium, increased exercise, adequate sleep.  Results reviewed and written information provided.   Total time in face-to-face counseling 15 minutes.   F/U Clinic Visit in 1 month.   Patient seen with:  Adam Phenix, PharmD Candidate  [employer]  Class of 2024   Butch Penny, PharmD, [place], CPP Clinical Pharmacist [facility] [phone]

## 2020-09-17 ENCOUNTER — Ambulatory Visit: Payer: Medicaid Other

## 2020-10-10 ENCOUNTER — Other Ambulatory Visit: Payer: Self-pay

## 2020-10-10 MED FILL — Atorvastatin Calcium Tab 40 MG (Base Equivalent): ORAL | 90 days supply | Qty: 90 | Fill #0 | Status: AC

## 2020-10-10 MED FILL — Lisinopril Tab 40 MG: ORAL | 30 days supply | Qty: 30 | Fill #0 | Status: AC

## 2020-10-14 ENCOUNTER — Other Ambulatory Visit: Payer: Self-pay

## 2020-10-20 ENCOUNTER — Ambulatory Visit: Payer: Medicaid Other

## 2020-10-27 ENCOUNTER — Other Ambulatory Visit: Payer: Self-pay

## 2020-10-27 ENCOUNTER — Ambulatory Visit: Payer: Self-pay | Attending: Family Medicine

## 2020-11-17 ENCOUNTER — Encounter: Payer: Self-pay | Admitting: Family Medicine

## 2020-11-17 ENCOUNTER — Other Ambulatory Visit: Payer: Self-pay

## 2020-11-17 ENCOUNTER — Ambulatory Visit: Payer: Self-pay | Attending: Family Medicine | Admitting: Family Medicine

## 2020-11-17 VITALS — BP 163/77 | HR 108 | Ht 61.0 in | Wt 110.2 lb

## 2020-11-17 DIAGNOSIS — I10 Essential (primary) hypertension: Secondary | ICD-10-CM

## 2020-11-17 DIAGNOSIS — E7800 Pure hypercholesterolemia, unspecified: Secondary | ICD-10-CM

## 2020-11-17 MED ORDER — LISINOPRIL 40 MG PO TABS
ORAL_TABLET | Freq: Every day | ORAL | 1 refills | Status: DC
  Filled 2020-11-17: qty 30, 30d supply, fill #0
  Filled 2020-12-16: qty 30, 30d supply, fill #1
  Filled 2021-01-14: qty 30, 30d supply, fill #2
  Filled 2021-02-12: qty 30, 30d supply, fill #3

## 2020-11-17 MED ORDER — ATORVASTATIN CALCIUM 40 MG PO TABS
1.0000 | ORAL_TABLET | Freq: Every day | ORAL | 1 refills | Status: DC
  Filled 2020-11-17: qty 90, 90d supply, fill #0
  Filled 2021-01-14: qty 30, 30d supply, fill #0
  Filled 2021-02-12: qty 30, 30d supply, fill #1
  Filled 2021-03-12: qty 30, 30d supply, fill #2
  Filled 2021-04-13: qty 30, 30d supply, fill #3
  Filled 2021-05-12: qty 30, 30d supply, fill #4

## 2020-11-17 NOTE — Patient Instructions (Signed)
Qu?n l t?ng huy?t p Managing Your Hypertension T?ng huy?t p, cn ???c g?i l huy?t p cao, l khi l?c b?m mu p vo thnh ??ng m?ch qu m?nh. Cc ??ng m?ch l cc m?ch mu mang mu t? tim ?i kh?p c? th? c?a qu v?. T?ng huy?t p bu?c tim ph?i lm vi?c nhi?u h?n ?? b?m mu v c th? khi?n cho cc ??ng m?ch b? h?p ho?c c?ng. Hi?u bi?t v? cc ch? s? huy?t p Huy?t p m?c tiu c nhn c?a qu v? c th? khc nhau ty thu?c v tnh tr?ng b?nh l, tu?i v cc y?u t? khc. Ch? s? huy?t p g?m m?t ch? s? cao trn m?t ch? s? th?p. L t??ng l huy?t a?p cu?a quy? vi? c?n ph?i d??i 120/80. Qu v? nn bi?t r?ng:  Ch? s? ??u tin, hay ch? s? cao nh?t, ???c g?i l huy?t p tm thu. ?y l s? ?o p l?c trong ??ng m?ch khi tim qu v? ??p.  Ch? s? th? hai, hay ch? s? th?p nh?t, ???c g?i l huy?t p tm tr??ng. ?y l s? ?o p l?c trong ??ng m?ch khi tim qu v? ngh?. Huy?t p ???c phn lo?i thnh b?n giai ?o?n. D?a trn ch? s? huy?t p c?a qu v?, chuyn gia ch?m [place] s?c kh?e c?a qu v? c th? s? d?ng nh?ng giai ?o?n sau ?y ?? xc ??nh lo?i ?i?u tr? qu v? c?n, n?u c. Huy?t p tm thu v huy?t p tm tr??ng ???c ?o theo ??n v? mmHg. Bnh th??ng  Huy?t p tm thu: d??i 120.  Huy?t p tm tr??ng: d??i 80. Cao  Huy?t p tm thu: 120-129.  Huy?t p tm tr??ng: d??i 80. T?ng huy?t p giai ?o?n 1  Huy?t p tm thu: 130-139.  Huy?t p tm tr??ng: 80-89. T?ng huy?t p giai ?o?n 2  Huy?t p tm thu: 140 tr? ln.  Huy?t p tm tr??ng: t? 90 tr? ln. Tnh tr?ng ny c th? ?nh h??ng ??n ti nh? th? no? Qu?n l t?ng huy?t p c?a qu v? l m?t trch nhi?m quan tr?ng. Theo th?i gian, t?ng huy?t p c th? gy t?n th??ng cc ??ng m?ch v lm gi?m l?u l??ng mu ??n cc b? ph?n quan tr?ng c?a c? th?, bao g?m c? no, tim v th?n. T?ng huy?t p khng ???c ki?m sot ho?c ?i?u tr? c th? d?n t?i:  Nh?i mu c? tim.  ??t qu?Marland Kitchen  M?ch mu b? y?u (ch?ng phnh m?ch).  Suy tim.  T?n th??ng th?n.  T?n th??ng  m?t.  H?i ch?ng chuy?n ha.  Cc v?n ?? v? t?p trung v tr nh?.  Sa st tr tu? do m?ch mu. Ti c th? th??c hi?n nh?ng hnh ??ng no ?? qu?n l tnh tr?ng ny? C th? qu?n l t?ng huy?t p b?ng cch thay ??i l?i s?ng v c th? l b?ng cch dng thu?c. Chuyn gia ch?m [place] s?c kh?e c?a qu v? s? gip qu v? ln k? ho?ch ??a huy?t p v? gi?i h?n bnh th??ng. Dinh d??ng  ?n ch? ?? giu ch?t x? v kali v t mu?i (natri), t ???ng b? sung v t ch?t bo. M?t k? ho?ch ?n m?u c tn l ch? ?? ?n Cc ph??ng php ti?p c?n ch? ?? ?n u?ng ?? ng?n ch?n t?ng huy?t p (DASH). ?n theo cch ny: ? ?n nhi?u tri cy v rau c? t??i. Vo m?i b?a ?n, c? g?ng dnh m?t n?a ??a cho tri cy v rau c?. ? ?  n ng? c?c nguyn cm, ch?ng h?n nh? m ?ng lm t? b?t m nguyn cm, g?o l?t, ho?c bnh m t? b?t m nguyn cm. Cho ngu? c?c nguyn ca?m va?o kho?ng m?t ph?n t? ??a. ? ?n cc s?n ph?m s?a t bo. ? Trnh nh?ng mi?ng th?t nhi?u m?, th?t ch? bi?n s?n ho?c th?t ??p mu?i v th?t gia c?m c da. Dnh kho?ng m?t ph?n t? ??a c?a qu v? cho cc protein n?c, ch?ng h?n nh? c, th?t g khng da, ??u, tr?ng v ??u ph?. ? Trnh nh?ng th?c ph?m ch? bi?n s?n ho?c lm s?n. Nh?ng th?c ph?m ny th??ng c nhi?u natri, b? sung ???ng v ch?t bo h?n.  Gi?m l??ng dng natri hng ngy c?a qu v?. H?u h?t nh?ng ng??i b? t?ng huy?t p ??u nn ?n d??i 1.500 mg natri m?i ngy.   L?i s?ng  H?p tc v?i chuyn gia ch?m [place] s?c kh?e c?a qu v? ?? duy tr tr?ng l??ng c? th? c l?i cho s?c kh?e ho?c ?? gi?m cn. Hy h?i xem tr?ng l??ng no l l t??ng cho qu v?.  Dnh t nh?t 30 pht t?p th? d?c c th? khi?n tim qu v? ??p nhanh h?n (t?p th? d?c nh?p ?i?u) h?u h?t cc ngy trong tu?n. Cc ho?t ??ng c th? bao g?m ?i b?, b?i, ho?c ??p xe.  ??a vo bi t?p t?ng s?c m?nh c? b?p (bi t?p khng l?c), ch?ng h?n nh? nng t?, trong khun kh? thi quen luy?n t?p hng tu?n c?a qu v?. C? g?ng t?p nh?ng lo?i bi t?p ny trong vng 30 pht, t nh?t l  3 ngy m?i tu?n.  Khng s? d?ng b?t k? s?n ph?m no c nicotine ho?c thu?c l, ch?ng ha?n nh? thu?c l d?ng ht, thu?c l ?i?n t? v thu?c l d?ng nhai. N?u qu v? c?n gip ?? ?? cai thu?c, hy h?i chuyn gia ch?m [place] s?c kh?e.  Ki?m sot b?t k? tnh tr?ng ko di (m?n tnh) no m qu v? c, ch?ng h?n nh? cholesterol cao ho?c ti?u ???ng.  Xc ??nh cc ngu?n gy c?ng th?ng v tm cch qu?n l c?ng th?ng. Vi?c ny c th? bao g?m thi?n ??nh, th? su ho?c dnh th?i gian cho cc ho?t ??ng vui v?.   S? d?ng r??u  Khng u?ng r??u n?u: ? Chuyn gia ch?m [place] s?c kh?e khuyn qu v? khng u?ng r??u. ? Qu v? c [place], c th? c [place], ho?c c k? ho?ch c [place].  N?u qu v? u?ng r??u: ? Gi?i h?n l??ng r??u qu v? u?ng ? m?c:  0-1 ly/ngy ??i v?i n? gi?i.  0-2 ly/ngy ??i v?i nam gi?i. ? Bi?t r m?t ly c bao nhiu r??u. ? M?, m?t ly t??ng ???ng v?i m?t chai bia 12 ao x? (355 mL), m?t ly r??u vang 5 ao x? (148 mL), ho?c m?t ly r??u m?nh 1 ao x? (44 mL). Thu?c Chuyn gia ch?m [place] s?c kh?e c th? k ??n thu?c n?u thay ??i l?i s?ng khng ?? ?? ??a huy?t p v? m?c c th? ki?m sot ???c v n?u:  Huy?t p tm thu c?a qu v? t? 130 tr? ln.  Huy?t p tm tr??ng c?a qu v? t? 80 tr? ln. Ch? s? d?ng thu?c theo h??ng d?n c?a chuyn gia ch?m [place] s?c kh?e cu?a quy? vi?. Lm theo ch? d?n m?t cch c?n th?n. Thu?c ?i?u tr? huy?t p ph?i ???c dng theo ch? d?n c?a chuyn gia ch?m [place] s?c kh?e. Thu?c  c?ng s? khng c tc d?ng khi qu v? b? li?u. Vi?c b? li?u thu?c c?ng lm qu v? c nguy c? pht sinh v?n ??Danice Goltz di Tr??c khi qu v? theo di huy?t p:  Khng ht thu?c, u?ng ?? u?ng ch?a caffein, ho?c t?p th? d?c trong vng 30 pht tr??c khi ?o.  ?i v? sinh v ?i ti?u (ti?u ti?n).  Ng?i yn t?nh trong t nh?t 5 pht tr??c khi ?o. Theo di huy?t p c?a qu v? t?i nh theo h??ng d?n c?a chuyn gia ch?m [place] s?c kh?e. ?? lm ?i?u ny:  Ng?i th?ng v c t?a l?ng.  ?? hai bn chn b?ng ph?ng trn sn nh. Khng  b?t cho chn.  Ch?ng cnh tay trn m?t b? m?t ph?ng, ch?ng h?n nh? bn. ??m b?o r?ng ph?n cnh tay trn c?a qu v? ngang b?ng tim.  M?i l?n qu v? ?o, hy l?y hai ho?c ba ch? s? cch nhau m?t pht v ghi l?i cc k?t qu? ?. Qu v? c?ng c th? c?n c chuyn gia ch?m [place] s?c kho? ki?m tra huy?t p c?a qu v? th??ng xuyn.   Thng tin chung  Trao ??i v?i chuyn gia ch?m [place] s?c kh?e c?a qu v? v? ch? ?? ?n, thi qun luy?n t?p v cc y?u t? l?i s?ng khc c th? gp ph?n lm t?ng huy?t p.  Cng v?i chuyn gia ch?m [place] s?c kh?e xem xt l?i t?t c? cc lo?i thu?c qu v? dng b?i v c th? c tc d?ng ph? ho?c t??ng tc thu?c.  Tun th? t?t c? cc l?n khm theo ch? d?n c?a chuyn gia ch?m [place] s?c kh?e. Chuyn gia ch?m [place] s?c kh?e c?a qu v? c th? gip qu v? t?o ra v ?i?u ch?nh k? ho?ch qu?n l huy?t p cao. N?i tm ki?m thm thng tin  National Heart, Lung, and Blood Institute (Vi?n Tim, Ph?i v Mu Qu?c gia): [email or web address]  American Heart Association (Hi?p h?i Tim m?ch [place]?): www.heart.org Hy lin l?c v?i chuyn gia ch?m [place] s?c kh?e n?u:  Qu v? ngh? l qu v? c ph?n ?ng v?i cc lo?i thu?c ? dng.  Qu v? b? ?au ??u l?p ?i l?p l?i (ti pht).  Qu v? c?m th?y chng m?t.  Qu v? b? s?ng ? c? chn.  Qu v? c v?n ?? v? th? l?c. Yu c?u tr? gip ngay l?p t?c n?u:  Qu v? b? ?au ??u r?t nhi?u ho?c l l?n.  Qu v? b? y?u b?t th??ng ho?c t b, ho?c c?m th?y b? ng?t.  Qu v? b? ?au r?t nhi?u ? ng?c ho?c b?ng.  Qu v? nn nhi?u l?n.  Qu v? b? kh th?. Nh?ng tri?u ch?ng ny c th? l bi?u hi?n c?a m?t v?n ?? nghim tr?ng c?n c?p c?u. Khng ch? xem tri?u ch?ng c h?t khng. Hy ?i khm ngay l?p t?c. G?i cho d?ch v? c?p c?u t?i ??a ph??ng (911 ? [place]?). Khng t? li xe ??n b?nh vi?n. Tm t?t  T?ng huy?t p l khi l?c b?m mu qua cc ??ng m?ch c?a qu v? qu m?nh. N?u tnh tr?ng ny khng ???c ki?m sot, n c th? khi?n qu v? c nguy c? b? cc bi?n ch?ng nghim  tr?ng.  Huy?t p m?c tiu c nhn c?a qu v? c th? khc nhau ty thu?c v tnh tr?ng b?nh l, tu?i v cc y?u t? khc. ??i v?i h?u h?t m?i ng??i, huy?t p bnh th??ng l d??i 120/80.  Qu?n l t?ng huy?t p b?ng cch thay ??i l?i s?ng, dng thu?c, ho?c c? hai.  Thay ??i l?i s?ng ?? gip qu?n l t?ng huy?t p bao g?m gi?m cn, ?n ch? ?? ?n c l?i cho s?c kh?e, t natri, t?p th? d?c nhi?u h?n, ng?ng ht thu?c v h?n ch? u?ng r??u. Thng tin ny khng nh?m m?c ?ch thay th? cho l?i khuyn m chuyn gia ch?m [place] s?c kh?e ni v?i qu v?. Hy b?o ??m qu v? ph?i th?o lu?n b?t k? v?n ?? g m qu v? c v?i chuyn gia ch?m [place] s?c kh?e c?a qu v?. Document Revised: 07/06/2019 Document Reviewed: 06/28/2019 Elsevier Patient Education  2021 [employer].

## 2020-11-17 NOTE — Progress Notes (Signed)
Subjective:  Patient ID: Amber Grant, female    DOB: [birth year]  Age: 60 y.o. MRN: [id number]  CC: Hypertension   HPI Amber Grant is a 60 year old female with a history of Hypertension, Hyperlipidemia here for a follow up visit.  Interval History: She gets nervous at the [employer] office hence her elevated BP. She states at home her systolic BP are in the 502. She has her BP monitor with her and on checking it in the Clinic with her cuff BP reads 165/93.  I have reviewed the memory of her blood pressure monitor and her blood pressures at home sit around 120/70. She is compliant with her statin and denies adverse effects from her medication.  Tolerating her lisinopril with no complaints of cough. She has no additional concerns today.  Past Medical History:  Diagnosis Date  . Hypertension     History reviewed. No pertinent surgical history.  Family History  Problem Relation Age of Onset  . Diabetes Brother     No Known Allergies  Outpatient Medications Prior to Visit  Medication Sig Dispense Refill  . Blood Glucose Monitoring Suppl (TRUE METRIX METER) w/Device KIT Use as directed to check blood sugar in the morning. 1 kit 0  . cholecalciferol (VITAMIN D) 1000 UNITS tablet Take 1,000 Units by mouth daily.    Marland Kitchen glucose blood (TRUE METRIX BLOOD GLUCOSE TEST) test strip Use as instructed to check blood sugar in the morning. 100 each 11  . Multiple Vitamins-Minerals (MULTIVITAMIN PO) Take by mouth.    . TRUEplus Lancets 28G MISC Use to check blood sugar once in the morning. 100 each 11  . atorvastatin (LIPITOR) 40 MG tablet TAKE 1 TABLET (40 MG TOTAL) BY MOUTH DAILY. 90 tablet 1  . lisinopril (ZESTRIL) 40 MG tablet TAKE 1 TABLET (40 MG TOTAL) BY MOUTH DAILY. 90 tablet 1   No facility-administered medications prior to visit.     ROS Review of Systems  Constitutional: Negative for activity change, appetite change and fatigue.  HENT: Negative for congestion, sinus pressure and  sore throat.   Eyes: Negative for visual disturbance.  Respiratory: Negative for cough, chest tightness, shortness of breath and wheezing.   Cardiovascular: Negative for chest pain and palpitations.  Gastrointestinal: Negative for abdominal distention, abdominal pain and constipation.  Endocrine: Negative for polydipsia.  Genitourinary: Negative for dysuria and frequency.  Musculoskeletal: Negative for arthralgias and back pain.  Skin: Negative for rash.  Neurological: Negative for tremors, light-headedness and numbness.  Hematological: Does not bruise/bleed easily.  Psychiatric/Behavioral: Negative for agitation and behavioral problems.    Objective:  BP (!) 163/77   Pulse (!) 108   Ht '5\' 1"'  (1.549 m)   Wt 110 lb 3.2 oz (50 kg)   SpO2 100%   BMI 20.82 kg/m   BP/Weight 11/17/2020 12/19/4126 12/19/6765  Systolic BP 209 470 962  Diastolic BP 77 86 83  Wt. (Lbs) 110.2 - -  BMI 20.82 - -      Physical Exam Constitutional:      Appearance: She is well-developed.  Neck:     Vascular: No JVD.  Cardiovascular:     Rate and Rhythm: Normal rate.     Heart sounds: Normal heart sounds. No murmur heard.   Pulmonary:     Effort: Pulmonary effort is normal.     Breath sounds: Normal breath sounds. No wheezing or rales.  Chest:     Chest wall: No tenderness.  Abdominal:  General: Bowel sounds are normal. There is no distension.     Palpations: Abdomen is soft. There is no mass.     Tenderness: There is no abdominal tenderness.  Musculoskeletal:        General: Normal range of motion.     Right lower leg: No edema.     Left lower leg: No edema.  Neurological:     Mental Status: She is alert and oriented to person, place, and time.  Psychiatric:        Mood and Affect: Mood normal.     CMP Latest Ref Rng & Units 07/08/2020 06/20/2019 07/03/2018  Glucose 65 - 99 mg/dL 114(H) 126(H) 89  BUN 6 - 24 mg/dL '11 8 9  ' Creatinine 0.57 - 1.00 mg/dL 0.65 0.63 0.60  Sodium 134 - 144  mmol/L 135 133(L) 138  Potassium 3.5 - 5.2 mmol/L 4.3 4.3 4.0  Chloride 96 - 106 mmol/L 96 96 99  CO2 20 - 29 mmol/L 19(L) 22 16(L)  Calcium 8.7 - 10.2 mg/dL 9.5 9.7 10.1  Total Protein 6.0 - 8.5 g/dL 7.7 7.4 8.2  Total Bilirubin 0.0 - 1.2 mg/dL 0.9 0.8 0.9  Alkaline Phos 44 - 121 IU/L 56 70 69  AST 0 - 40 IU/L 27 31 39  ALT 0 - 32 IU/L 28 40(H) 69(H)    Lipid Panel     Component Value Date/Time   CHOL 149 07/08/2020 1049   TRIG 60 07/08/2020 1049   HDL 68 07/08/2020 1049   CHOLHDL 2.2 07/08/2020 1049   CHOLHDL 2.4 07/13/2016 0936   VLDL 18 07/13/2016 0936   LDLCALC 69 07/08/2020 1049    CBC    Component Value Date/Time   WBC 4.8 07/13/2016 0936   RBC 4.49 07/13/2016 0936   HGB 13.7 07/13/2016 0936   HCT 41.5 07/13/2016 0936   PLT 292 07/13/2016 0936   MCV 92.4 07/13/2016 0936   MCH 30.5 07/13/2016 0936   MCHC 33.0 07/13/2016 0936   RDW 13.2 07/13/2016 0936   LYMPHSABS 1,920 07/13/2016 0936   MONOABS 192 (L) 07/13/2016 0936   EOSABS 192 07/13/2016 0936   BASOSABS 48 07/13/2016 0936    Lab Results  Component Value Date   HGBA1C 5.7 (H) 06/20/2019    Assessment & Plan:  1. Pure hypercholesterolemia Controlled Low-cholesterol diet - atorvastatin (LIPITOR) 40 MG tablet; TAKE 1 TABLET (40 MG TOTAL) BY MOUTH DAILY.  Dispense: 90 tablet; Refill: 1  2. White coat syndrome with diagnosis of hypertension Uncontrolled but ambulatory blood pressures are normal We will make no regimen change today is elevation could be attributed to whitecoat syndrome Counseled on blood pressure goal of less than 130/80, low-sodium, DASH diet, medication compliance, 150 minutes of moderate intensity exercise per week. Discussed medication compliance, adverse effects. - lisinopril (ZESTRIL) 40 MG tablet; TAKE 1 TABLET (40 MG TOTAL) BY MOUTH DAILY.  Dispense: 90 tablet; Refill: 1    Meds ordered this encounter  Medications  . atorvastatin (LIPITOR) 40 MG tablet    Sig: TAKE 1 TABLET  (40 MG TOTAL) BY MOUTH DAILY.    Dispense:  90 tablet    Refill:  1  . lisinopril (ZESTRIL) 40 MG tablet    Sig: TAKE 1 TABLET (40 MG TOTAL) BY MOUTH DAILY.    Dispense:  90 tablet    Refill:  1    Follow-up: Return in about 3 months (around 02/17/2021) for Chronic medical conditions.       Charlott Rakes, MD,  FAAFP. [facility] [place], [place]   11/17/2020, 3:37 PM

## 2020-12-16 ENCOUNTER — Other Ambulatory Visit: Payer: Self-pay

## 2021-01-14 ENCOUNTER — Other Ambulatory Visit: Payer: Self-pay

## 2021-01-15 ENCOUNTER — Other Ambulatory Visit: Payer: Self-pay

## 2021-02-12 ENCOUNTER — Other Ambulatory Visit: Payer: Self-pay

## 2021-02-18 ENCOUNTER — Encounter: Payer: Self-pay | Admitting: Family Medicine

## 2021-02-18 ENCOUNTER — Other Ambulatory Visit: Payer: Self-pay

## 2021-02-18 ENCOUNTER — Ambulatory Visit: Payer: Self-pay | Attending: Family Medicine | Admitting: Family Medicine

## 2021-02-18 VITALS — BP 157/79 | HR 106 | Temp 98.9°F | Resp 16 | Ht 60.0 in | Wt 109.0 lb

## 2021-02-18 DIAGNOSIS — E782 Mixed hyperlipidemia: Secondary | ICD-10-CM

## 2021-02-18 DIAGNOSIS — I10 Essential (primary) hypertension: Secondary | ICD-10-CM

## 2021-02-18 DIAGNOSIS — Z23 Encounter for immunization: Secondary | ICD-10-CM

## 2021-02-18 DIAGNOSIS — Z131 Encounter for screening for diabetes mellitus: Secondary | ICD-10-CM

## 2021-02-18 DIAGNOSIS — Z1211 Encounter for screening for malignant neoplasm of colon: Secondary | ICD-10-CM

## 2021-02-18 MED ORDER — LISINOPRIL 40 MG PO TABS
ORAL_TABLET | Freq: Every day | ORAL | 1 refills | Status: DC
  Filled 2021-02-18: qty 90, fill #0
  Filled 2021-03-12: qty 30, 30d supply, fill #0
  Filled 2021-04-13: qty 30, 30d supply, fill #1
  Filled 2021-05-12: qty 30, 30d supply, fill #2
  Filled 2021-06-10: qty 30, 30d supply, fill #3
  Filled 2021-07-09: qty 30, 30d supply, fill #4
  Filled 2021-07-09: qty 30, 30d supply, fill #0

## 2021-02-18 NOTE — Progress Notes (Signed)
Subjective:  Patient ID: Amber Grant, female    DOB: [birth year]  Age: 60 y.o. MRN: [id number]  CC: Hypertension   HPI Amber Grant is a 60 y.o. year old female with a history of Hypertension, Hyperlipidemia here for a follow up visit.  Interval History: Her blood pressure is elevated today and she informs me it is usually elevated at doctors visits due to her whitecoat syndrome superimposed on her hypertension as she has controlled blood pressures at home. Endorses compliance with lisinopril and atorvastatin and has no adverse effects from her medications. Mammogram order was placed in 06/2020 however she is yet to undergo this as she is unaware of how to secure an appointment. Denies additional concerns today. Past Medical History:  Diagnosis Date   Hypertension     No past surgical history on file.  Family History  Problem Relation Age of Onset   Diabetes Brother     No Known Allergies  Outpatient Medications Prior to Visit  Medication Sig Dispense Refill   atorvastatin (LIPITOR) 40 MG tablet TAKE 1 TABLET (40 MG TOTAL) BY MOUTH DAILY. 90 tablet 1   Blood Glucose Monitoring Suppl (TRUE METRIX METER) w/Device KIT Use as directed to check blood sugar in the morning. 1 kit 0   cholecalciferol (VITAMIN D) 1000 UNITS tablet Take 1,000 Units by mouth daily.     glucose blood (TRUE METRIX BLOOD GLUCOSE TEST) test strip Use as instructed to check blood sugar in the morning. 100 each 11   Multiple Vitamins-Minerals (MULTIVITAMIN PO) Take by mouth.     TRUEplus Lancets 28G MISC Use to check blood sugar once in the morning. 100 each 11   lisinopril (ZESTRIL) 40 MG tablet TAKE 1 TABLET (40 MG TOTAL) BY MOUTH DAILY. 90 tablet 1   No facility-administered medications prior to visit.     ROS Review of Systems  Constitutional:  Negative for activity change, appetite change and fatigue.  HENT:  Negative for congestion, sinus pressure and sore throat.   Eyes:  Negative for visual  disturbance.  Respiratory:  Negative for cough, chest tightness, shortness of breath and wheezing.   Cardiovascular:  Negative for chest pain and palpitations.  Gastrointestinal:  Negative for abdominal distention, abdominal pain and constipation.  Endocrine: Negative for polydipsia.  Genitourinary:  Negative for dysuria and frequency.  Musculoskeletal:  Negative for arthralgias and back pain.  Skin:  Negative for rash.  Neurological:  Negative for tremors, light-headedness and numbness.  Hematological:  Does not bruise/bleed easily.  Psychiatric/Behavioral:  Negative for agitation and behavioral problems.    Objective:  BP (!) 157/79   Pulse (!) 106   Temp 98.9 F (37.2 C) (Oral)   Resp 16   Ht 5' (1.524 m)   Wt 109 lb (49.4 kg)   SpO2 100%   BMI 21.29 kg/m   BP/Weight 02/18/2021 01/19/8675 12/13/945  Systolic BP 096 283 662  Diastolic BP 79 77 86  Wt. (Lbs) 109 110.2 -  BMI 21.29 20.82 -      Physical Exam Constitutional:      Appearance: She is well-developed.  Cardiovascular:     Rate and Rhythm: Tachycardia present.     Heart sounds: Normal heart sounds. No murmur heard. Pulmonary:     Effort: Pulmonary effort is normal.     Breath sounds: Normal breath sounds. No wheezing or rales.  Chest:     Chest wall: No tenderness.  Abdominal:     General: Bowel sounds are  normal. There is no distension.     Palpations: Abdomen is soft. There is no mass.     Tenderness: There is no abdominal tenderness.  Musculoskeletal:        General: Normal range of motion.     Right lower leg: No edema.     Left lower leg: No edema.  Neurological:     Mental Status: She is alert and oriented to person, place, and time.  Psychiatric:        Mood and Affect: Mood normal.    CMP Latest Ref Rng & Units 07/08/2020 06/20/2019 07/03/2018  Glucose 65 - 99 mg/dL 114(H) 126(H) 89  BUN 6 - 24 mg/dL _0 Creatinine 0.57 - 1.00 mg/dL 0.65 0.63 0.60  Sodium 134 - 144 mmol/L 135 133(L) 138   Potassium 3.5 - 5.2 mmol/L 4.3 4.3 4.0  Chloride 96 - 106 mmol/L 96 96 99  CO2 20 - 29 mmol/L 19(L) 22 16(L)  Calcium 8.7 - 10.2 mg/dL 9.5 9.7 10.1  Total Protein 6.0 - 8.5 g/dL 7.7 7.4 8.2  Total Bilirubin 0.0 - 1.2 mg/dL 0.9 0.8 0.9  Alkaline Phos 44 - 121 IU/L 56 70 69  AST 0 - 40 IU/L 27 31 39  ALT 0 - 32 IU/L 28 40(H) 69(H)    Lipid Panel     Component Value Date/Time   CHOL 149 07/08/2020 1049   TRIG 60 07/08/2020 1049   HDL 68 07/08/2020 1049   CHOLHDL 2.2 07/08/2020 1049   CHOLHDL 2.4 07/13/2016 0936   VLDL 18 07/13/2016 0936   LDLCALC 69 07/08/2020 1049    CBC    Component Value Date/Time   WBC 4.8 07/13/2016 0936   RBC 4.49 07/13/2016 0936   HGB 13.7 07/13/2016 0936   HCT 41.5 07/13/2016 0936   PLT 292 07/13/2016 0936   MCV 92.4 07/13/2016 0936   MCH 30.5 07/13/2016 0936   MCHC 33.0 07/13/2016 0936   RDW 13.2 07/13/2016 0936   LYMPHSABS 1,920 07/13/2016 0936   MONOABS 192 (L) 07/13/2016 0936   EOSABS 192 07/13/2016 0936   BASOSABS 48 07/13/2016 0936    Lab Results  Component Value Date   HGBA1C 5.7 (H) 06/20/2019    Assessment & Plan:  1. White coat syndrome with diagnosis of hypertension Uncontrolled Ambulatory blood pressures are normal hence I will make no regimen changes Counseled on blood pressure goal of less than 130/80, low-sodium, DASH diet, medication compliance, 150 minutes of moderate intensity exercise per week. Discussed medication compliance, adverse effects. - Basic Metabolic Panel - lisinopril (ZESTRIL) 40 MG tablet; TAKE 1 TABLET (40 MG TOTAL) BY MOUTH DAILY.  Dispense: 90 tablet; Refill: 1  2. Screening for diabetes mellitus - Hemoglobin A1c  3. Screening for colon cancer - Fecal occult blood, imunochemical([facility])  4. Need for influenza vaccination - Flu Vaccine QUAD 6+ mos PF IM (Fluarix Quad PF)  5.  Hyperlipidemia Controlled Continue atorvastatin Low-cholesterol diet  Health Care Maintenance: We have  provided her with contact to schedule her mammogram. Meds ordered this encounter  Medications   lisinopril (ZESTRIL) 40 MG tablet    Sig: TAKE 1 TABLET (40 MG TOTAL) BY MOUTH DAILY.    Dispense:  90 tablet    Refill:  1     Follow-up: Return in about 3 months (around 05/20/2021) for Medical conditions.       Charlott Rakes, MD, FAAFP. [facility] [place], [place]   02/18/2021,  11:54 AM

## 2021-02-18 NOTE — Patient Instructions (Signed)
Influenza Virus Vaccine injection ?y l thu?c g? THU?C CHU?NG NG??A VIRUS CU?M (INFLUENZA) giu?p gia?m nguy c? b? b?nh cu?m (influenza), cn ???c g?i l c?m cm (flu). Thu?c chu?ng ng??a na?y chi? giu?p ba?o v? quy? vi? kho?i m?t s? chu?ng cu?m. Thu?c ny c th? ???c dng cho nh?ng m?c ?ch khc; hy h?i ng??i cung c?p d?ch v? y t? ho?c d??c s? c?a mnh, n?u qu v? c th?c m?c. (CC) NHN HI?U PH? BI?N: Afluria, Afluria Quadrivalent, Agriflu, Alfuria, FLUAD, FLUAD Quadrivalent, Fluarix, Fluarix Quadrivalent, Flublok, Flublok Quadrivalent, FLUCELVAX, FLUCELVAX Quadrivalent, Flulaval, Flulaval Quadrivalent, Fluvirin, Fluzone, Fluzone High-Dose, Fluzone Intradermal, Fluzone Quadrivalent Ti c?n ph?i bo cho ng??i cung c?p d?ch v? y t? c?a mnh ?i?u g tr??c khi dng thu?c ny? H? c?n bi?t li?u qu v? c b?t k? tnh tr?ng no sau dy khng: cc ri loa?n v? ch?y mu, ch?ng h?n nhu b?nh ua ch?y mu (hemophilia) s?t, nhi?m trng H?i chung Guillain-Barre hoa?c cac vn d` khc v` th`n kinh cc v?n d? v? h? mi?n d?ch nhi?m HIV ho?c AIDS lu?ng ti?u c?u trong mau th?p ch?ng da xo c?ng ph?n ?ng b?t thu?ng ho?c d? ?ng v?i thu?c ch?ng ng?a virus cm (influenza virus) ho?c nh?a latex pha?n ung bt thuo`ng hoa?c di? ung voi cac du?c ph?m khac, th?c ph?m, thuc nhu?m, hoa?c cht ba?o qua?n. Thu?c ch?ng ng?a c?a cc thuong hi?u khc nhau c ch?a cc ch?t gy d? ?ng khc nhau. M?t s? thu?c ch?ng ng?a c th? c ch?a nh?a latex ho?c tr?ng. Hy ni v?i bc si v? cc ch?ng d? ?ng c?a mnh d? b?o d?m r?ng qu v? s? nh?n du?c lo?i thu?c ch?ng ng?a thch h?p. dang c thai hoa?c d?nh co thai dang cho con bu Ti nn s? d?ng thu?c ny nh? th? no? Thu?c chu?ng ng??a ny ?? tim vo b?p th?t ho??c tim d???i da. Thu?c th??ng ???c s? d?ng b?i chuyn vin y t?. T? Thng Tin v? Thu?c Ch?ng Ng?a s? ???c pht tr??c m?i l?n ch?ng ng?a. Hy ??c k? t? thng tin ny m?i l?n. T? thng tin c th? thay ??i  th??ng xuyn. Hy bn v?i bc s? ho?c chuyn vin y t? c?a mnh ?? xem thu?c ch?ng ng?a no thch h?p v?i qu v?. M?t vi lo?i thu?c ch?ng ng?a khng ???c ?em dng cho t?t c? cc nhm tu?i. Qu li?u: N?u qu v? cho r?ng mnh ? dng qu nhi?u thu?c ny, th hy lin l?c v?i trung tm ki?m sot ch?t ??c ho?c phng c?p c?u ngay l?p t?c. L?U : Thu?c ny ch? dnh ring cho qu v?. Khng chia s? thu?c ny v?i nh?ng ng??i khc. N?u ti l? qun m?t li?u th sao? ?i?u ny khng p d?ng. Nh?ng g c th? t??ng tc v?i thu?c ny? ho?a tri? li?u ho??c xa? tri? li?u m?t s? thu?c la?m y?u h? th?ng mi?n d?ch c?a qu v?, ch?ng h?n nh? etanercept, anakinra, infliximab, va? adalimumab m?t s? thu?c ?i?u tr? ho?c phng ng?a c?c mu ?ng, ch?ng h?n nh? warfarin phenytoin cc thu?c steroid, ch?ng h?n nh? prednisone ho?c cortisone theophylline cc thu?c ch?ng ng?a Danh sch ny c th? khng m t? ?? h?t cc t??ng tc c th? x?y ra. Hy ??a cho ng??i cung c?p d?ch v? y t? c?a mnh danh sch t?t c? cc thu?c, th?o d??c, cc thu?c khng c?n toa, ho?c cc ch? ph?m b? sung m qu v? dng. C?ng nn bo cho h? bi?t r?ng qu v? c ht thu?c, u?ng  r??u, ho?c c s? d?ng ma ty tri php hay khng. Vi th? c th? t??ng tc v?i thu?c c?a qu v?. Ti c?n ph?i theo di ?i?u g trong khi dng thu?c ny? Ha?y ba?o cho ba?c si? ho??c chuyn vin y t? bi?t b?t ky? ta?c du?ng phu? na?o khng bi?n m?t trong vo?ng 3 nga?y. Ha?y lin la?c v??i ba?c si? ho??c chuyn vin y t? c?a mnh, n?u b?t ky? tri?u ch??ng b?t th???ng na?o xu?t hi?n trong vo?ng 6 tu?n k? t?? khi tim thu?c chu?ng ng??a na?y. Quy? vi? v?n co? th? b? cu?m, nh?ng b?nh se? khng n??ng nh? th???ng l?. Quy? vi? khng th? b? cu?m do thu?c chu?ng ng??a. Thu?c chu?ng ng??a se? khng ba?o v? kho?i cc ch?ng ca?m la?nh ho??c ca?c b?nh co? th? gy s?t khc. C?n tim thu?c chu?ng ng??a na?y ha?ng n?m. Ti c th? nh?n th?y nh?ng tc d?ng ph? no khi dng  thu?c ny? Nh?ng tc d?ng ph? qu v? c?n ph?i bo cho bc s? ho?c chuyn vin y t? cng s?m cng t?t: cc ph?n ?ng d? ?ng, ch?ng h?n nh? da b? m?n ??, ng?a, n?i my ?ay, s?ng ? m?t, mi, ho?c l??i Cc tc d?ng ph? khng c?n ph?i ch?m [place] y t? (hy bo cho bc s? ho?c chuyn vin y t?, n?u cc tc d?ng ph? ny ti?p di?n ho?c gy phi?n toi): s?t ?au ??u ?au ho?c nh?c c? b?p b? ?au,  ?m, ??, ho?c s?ng ? ch? tim c?m th?y m?t m?i Nh?ng tc d?ng ph? qu v? c?n ph?i bo cho bc s? ho?c chuyn vin y t? cng s?m cng t?t: cc ph?n ?ng d? ?ng, ch?ng h?n nh? da b? m?n ??, ng?a, n?i my ?ay, s?ng ? m?t, mi, ho?c l??i Cc tc d?ng ph? khng c?n ph?i ch?m [place] y t? (hy bo cho bc s? ho?c chuyn vin y t?, n?u cc tc d?ng ph? ny ti?p di?n ho?c gy phi?n toi): s?t ?au ??u ?au ho?c nh?c c? b?p b? ?au,  ?m, ??, ho?c s?ng ? ch? tim c?m th?y m?t m?i Danh sch ny c th? khng m t? ?? h?t cc tc d?ng ph? c th? x?y ra. Xin g?i t?i bc s? c?a mnh ?? ???c c? v?n chuyn mn v? cc tc d?ng ph?Ladell Heads v? c th? t??ng trnh cc tc d?ng ph? cho FDA theo s? [phone]. Ti nn c?t gi? thu?c c?a mnh ? ?u? Thu?c chu?ng ng??a na?y se? ????c tim b??i chuyn vin y t? ta?i pho?ng ma?ch, hi?u thu?c, v?n pho?ng ba?c si? ho??c c? s?? y t? kha?c. Qu v? s? khng ???c c?p thu?c chu?ng ng??a ny ?? c?t gi? t?i nh. L?U : ?y l b?n tm t?t. N c th? khng bao hm t?t c? thng tin c th? c. N?u qu v? th?c m?c v? thu?c ny, xin trao ??i v?i bc s?, d??c s?, ho?c ng??i cung c?p d?ch v? y t? c?a mnh.  2022 Elsevier/Gold Standard (2020-09-08 00:00:00)

## 2021-02-19 ENCOUNTER — Other Ambulatory Visit: Payer: Self-pay | Admitting: Family Medicine

## 2021-02-19 DIAGNOSIS — Z1231 Encounter for screening mammogram for malignant neoplasm of breast: Secondary | ICD-10-CM

## 2021-02-19 LAB — BASIC METABOLIC PANEL
BUN/Creatinine Ratio: 12 (ref 9–23)
BUN: 8 mg/dL (ref 6–24)
CO2: 16 mmol/L — ABNORMAL LOW (ref 20–29)
Calcium: 9.5 mg/dL (ref 8.7–10.2)
Chloride: 95 mmol/L — ABNORMAL LOW (ref 96–106)
Creatinine, Ser: 0.66 mg/dL (ref 0.57–1.00)
Glucose: 95 mg/dL (ref 65–99)
Potassium: 4.3 mmol/L (ref 3.5–5.2)
Sodium: 134 mmol/L (ref 134–144)
eGFR: 102 mL/min/{1.73_m2} (ref >59)

## 2021-02-19 LAB — HEMOGLOBIN A1C
Est. average glucose Bld gHb Est-mCnc: 131 mg/dL
Hgb A1c MFr Bld: 6.2 % — ABNORMAL HIGH (ref 4.8–5.6)

## 2021-02-20 ENCOUNTER — Telehealth: Payer: Self-pay

## 2021-02-20 NOTE — Telephone Encounter (Signed)
-----   Message from Hoy Register, MD sent at 02/19/2021  4:24 PM EDT ----- Please inform the patient that labs are normal. Thank you.

## 2021-02-20 NOTE — Telephone Encounter (Signed)
Patient name and DOB has been verified Patient was informed of lab results. Patient had no questions.  

## 2021-02-21 LAB — FECAL OCCULT BLOOD, IMMUNOCHEMICAL: Fecal Occult Bld: NEGATIVE

## 2021-03-12 ENCOUNTER — Other Ambulatory Visit: Payer: Self-pay

## 2021-03-26 ENCOUNTER — Other Ambulatory Visit: Payer: Self-pay

## 2021-03-26 ENCOUNTER — Ambulatory Visit
Admission: RE | Admit: 2021-03-26 | Discharge: 2021-03-26 | Disposition: A | Discharge status: Home / Self Care | Payer: No Typology Code available for payment source | Source: Ambulatory Visit | Attending: Family Medicine | Admitting: Family Medicine

## 2021-03-26 DIAGNOSIS — Z1231 Encounter for screening mammogram for malignant neoplasm of breast: Secondary | ICD-10-CM

## 2021-04-02 ENCOUNTER — Telehealth: Payer: Self-pay

## 2021-04-02 NOTE — Telephone Encounter (Signed)
Patient was called and informed of lab results. Patient had no questions.   [name]:[id number]

## 2021-04-13 ENCOUNTER — Other Ambulatory Visit: Payer: Self-pay

## 2021-04-14 ENCOUNTER — Other Ambulatory Visit: Payer: Self-pay

## 2021-05-12 ENCOUNTER — Other Ambulatory Visit: Payer: Self-pay

## 2021-05-12 ENCOUNTER — Encounter: Payer: Self-pay | Admitting: Family Medicine

## 2021-05-12 ENCOUNTER — Ambulatory Visit: Payer: Self-pay | Attending: Family Medicine | Admitting: Family Medicine

## 2021-05-12 VITALS — BP 171/80 | HR 108 | Ht 60.0 in | Wt 111.6 lb

## 2021-05-12 DIAGNOSIS — I10 Essential (primary) hypertension: Secondary | ICD-10-CM

## 2021-05-12 DIAGNOSIS — E7800 Pure hypercholesterolemia, unspecified: Secondary | ICD-10-CM

## 2021-05-12 DIAGNOSIS — E119 Type 2 diabetes mellitus without complications: Secondary | ICD-10-CM | POA: Insufficient documentation

## 2021-05-12 DIAGNOSIS — E1169 Type 2 diabetes mellitus with other specified complication: Secondary | ICD-10-CM

## 2021-05-12 MED ORDER — ATORVASTATIN CALCIUM 40 MG PO TABS
40.0000 mg | ORAL_TABLET | Freq: Every day | ORAL | 1 refills | Status: DC
  Filled 2021-05-12: qty 90, 90d supply, fill #0
  Filled 2021-06-10 – 2021-07-09 (×2): qty 30, 30d supply, fill #0
  Filled 2021-07-09: qty 30, 30d supply, fill #1

## 2021-05-12 MED ORDER — AMLODIPINE BESYLATE 2.5 MG PO TABS
2.5000 mg | ORAL_TABLET | Freq: Every day | ORAL | 1 refills | Status: DC
  Filled 2021-05-12: qty 30, 30d supply, fill #0

## 2021-05-12 NOTE — Patient Instructions (Signed)
Ch??ng trnh ?n u?ng DASH DASH Eating Plan DASH l vi?t t?t c?a Dietary Approaches to Stop Hypertension, ngh?a l Ph??ng php ti?p c?n ch? ?? ?n u?ng ?? lm gi?m huy?t p. Ch??ng trnh ?n u?ng DASH l ch??ng trnh ?n u?ng lnh m?nh ? ???c ch?ng minh c tc d?ng: Gi?m huy?t p cao (t?ng huy?t p). Gi?m nguy c? b? ti?u ???ng tup 2, b?nh tim v ??t qu?. Gip gi?m cn. C nh?ng l?i khuyn no ?? tun th? k? ho?ch ny? ??c nhn th?c ph?m Ki?m tra nhn th?c ph?m ?? bi?t l??ng mu?i (natri) trn m?i kh?u ph?n. Ch?n nh?ng th?c ph?m c d??i 5 ph?n tr?m L??ng natri hng ngy. Ni chung, nh?ng th?c ph?m c d??i 300 mg natri m?i kh?u ph?n ph h?p ?? ??a vo ch??ng trnh ?n u?ng ny. ?? tm cc lo?i ng? c?c nguyn h?t, hy ki?m t? "nguyn h?t", l t? ??u tin trong danh sch thnh ph?n. Mua s?m Mua cc s?n ph?m dn nhn "natri th?p" ho?c "khng thm mu?i." Mua th?c ph?m t??i. Trnh nh?ng th?c ph?m ?ng h?p v cc mn ?n ch? bi?n s?n ho?c ?ng l?nh. N?u n??ng Trnh thm mu?i khi n?u ?n. S? d?ng gia v? khng co? mu?i ho?c th?o d??c thay v mu?i ?n ho?c mu?i bi?n. Ki?m tra v?i chuyn gia ch?m [place] s?c kh?e ho??c d???c sy? c?a quy? vi? tr??c khi s? d?ng cc s?n ph?m thay th? mu?i. Khng chin/rn ?? ?n. N?u ?? ?n b?ng nh?ng ph??ng php c l?i cho s?c kh?e nh? b? l, lu?c, n??ng v quay v hun nng. N?u ?n b?ng lo?i d?u t?t cho tim, ch?ng h?n nh? d?u  liu, d?u c?i, d?u b?, ??u nnh ho?c h??ng d??ng. Ln k? ho?ch cho b?a ?n  ?n ch? ?? ?n cn ??i bao g?m: T? 4 kh?u ph?n tri cy v 4 kh?u ph?n rau c? tr? ln m?i ngy. C? g?ng dnh m?t n?a ??a cho tri cy v rau c?. 6-8 kh?u ph?n ng? c?c nguyn cm m?i ngy. D??i 6 ao-x? (170 g) th?t n?c, th?t gia c?m ho?c c m?i ngy. M?t kh?u ph?n 3 ao-x? (85 g) th?t t??ng ???ng kch th??c c?a m?t b? bi. M?t qu? tr?ng t??ng ???ng 1 ao-x? (28 g). 2-3 kh?u ph?n s?a t bo m?i ngy. M?t kh?u ph?n l 1 c?c (237 mL). 1 kh?u ph?n qu? h?ch, cc lo?i h?t ho?c ??u dng 5 l?n m?i  tu?n. 2-3 kh?u ph?n m? t?t cho tim. Ch?t bo lnh m?nh c tn l axit bo omega-3 ???c tm th?y trong cc th?c ph?m nh? qu? c ch, h?t lanh, s?a t?ng c??ng v tr?ng. Nh?ng ch?t bo ny c?ng ???c tm th?y trong c n??c l?nh, ch?ng h?n nh? c mi, c h?i v c thu. Gi?i h?n l??ng: Th?c ph?m ?ng h?p ho?c ?ng gi s?n. Th?c ph?m giu ch?t bo chuy?n ha, ch?ng h?n nh? m?t s? th?c ph?m chin/rn. Th?c ph?m giu ch?t bo bo ha, ch?ng h?n nh? th?t m?. Cc mn trng mi?ng v ?? ng?t khc, ?? u?ng c ???ng v nh?ng th?c ph?m khc ch?a ???ng ph? gia. S?n ph?m t? s?a nguyn ch?t bo. Khng thm mu?i vo th?c ph?m tr??c khi ?n. Khng ?n qu 4 lng ?? tr?ng m?i tu?n. C? g?ng ?n t?i thi?u 2 b?a chay m?i tu?n. ?n nhi?u th?c ?n n?u t?i nh h?n v ?n i?t th??c ?n ?? nh hng, th??c ?n t? ch?n v th?c ?n nhanh h?n. L?i s?ng Khi ?n ? nh  hng, hy yu c?u th??c ?n c?a qu v? ???c n?u nha?t ho?c khng c mu?i, n?u c th?. N?u qu v? u?ng r??u: Gi?i h?n l??ng r??u qu v? u?ng ? m?c: 0-1 ly/ngy ??i v?i ph? n? khng mang thai. 0-2 ly/ngy ??i v?i nam gi?i. Bi?t r m?t ly c bao nhiu r??u. ? M?, m?t ly t??ng ???ng v?i m?t chai bia 12 ao x? (355 mL), m?t ly r??u vang 5 ao x? (148 mL), ho?c m?t ly r??u m?nh 1 ao x? (44 mL). Thng tin chung Trnh ?n trn 2.300 mg mu?i m?i ngy. N?u b? t?ng huy?t p, qu v? c th? c?n gi?m l??ng dng natri xu?ng ??n m?c 1.500 mg m?i ngy. H?p tc v?i chuyn gia ch?m [place] s?c kh?e c?a qu v? ?? duy tr tr?ng l??ng c? th? c l?i cho s?c kh?e ho?c ?? gi?m cn. Hy h?i xem tr?ng l??ng no l l t??ng cho qu v?. Dnh t nh?t 30 pht t?p th? d?c c th? khi?n tim qu v? ??p nhanh h?n (t?p th? d?c nh?p ?i?u) h?u h?t cc ngy trong tu?n. Cc ho?t ??ng c th? bao g?m ?i b?, b?i, ho?c ??p xe. H?p tc v?i chuyn gia ch?m [place] s?c kh?e ho?c bc s? chuyn khoa dinh d??ng c?a qu v? ?? ?i?u ch?nh ch??ng trnh ?n u?ng ph h?p v?i nhu c?u calo c?a c nhn qu v?. Ti nn ?n nh?ng th?c ph?m  no? Tri cy T?t c? tri cy t??i, kh, ho?c ?ng l?nh. Tri cy ?ng h?p d??i d?ng n??c p t? nhin (khng c b? sung ???ng). Derrek Gu c? Derrek Gu c? t??i ho?c ?ng l?nh (s?ng, h?p, bo? lo? ho?c n??ng). N???c p c chua v n??c rau p i?t natri ho??c gi?m natri. N??c x?t c chua va? h?n h?p c chua nho i?t natri ho??c gi?m natri. Rau ?o?ng h?p i?t natri ho??c gia?m natri. Ng? c?c Bnh m nguyn h?t ho?c nguyn cm. M ?ng nguyn h?t ho?c nguyn cm. Ga?o l??t. B?t y?n m?ch. H?t dim m?ch (quinoa). T?m la m (bulgur). Ng? c?c nguyn h?t v ng? c?c t natri. Bnh m pita. Bnh quy gin t bo, t natri. Bnh ng nguyn cm. Th?t v cc protein khc Ga? ho??c ga? ty bo? da. G ho?c g ty xay. Th?t heo l?c m?. C v h?i s?n. Lng tr?ng tr?ng. Cc lo?i ??u, ??u H Lan ho??c ??u l?ng kh. Qu? h?ch, b? h?t v cc lo?i h?t khng mu?i. ??u ?ng h?p khng ??p mu?i. Mi?ng th?t b n?c ? l?c m?. Th?t n?c n?u s?n t natri ho?c th?t ??p mu?i, ch?ng h?n nh? xc xch ho?c bnh m th?t. S?a S?a t bo (1%) ho?c khng bo (tch bo). Ph mt khng bo, t bo, ho?c gi?m bo. Ph mt s?a g?n kem ho?c ph mt ricotta t natri, khng bo. S?a chua t bo ho?c khng bo. Ph mt t bo, t natri. M? v d?u B? th?c v?t m?m khng c ch?t bo chuy?n ha. D?u th?c v?t. N??c x?t mayonnaise v mn tr?n sa lt t bo, gi?m bo ho?c lo?i nh? (gi?m natri). D?u c?i, d?u rum, d?u  liu, d?u b?, d?u ??u nnh v d?u h??ng d??ng. Qu? b?. Gia v? Th?o d??c. Gia v?. H?n h?p gia v? khng c mu?i. Cc th?c ph?m khc B?ng ng va? ba?nh quy khng mu?i. ?? ng?t khng bo. Nh?ng th?c ph?m li?t k ? trn c th? khng ph?i l m?t danh m?c ??y ?? cc lo?i th?c ph?m  v ?? u?ng m qu v? c th? ?n. Lin h? v?i chuyn gia dinh d??ng ?? c thm thng tin. Ti nn trnh nh?ng th?c ph?m no? Tri cy Tri cy ?ng h?p ngm xi-r loa?ng ho??c ???c. Tri cy kh. Tri cy ngm trong kem ho?c n??c x?t b?. Derrek Gu c? Derrek Gu c? tr?n kem ho??c xa?o. Cc lo?i  rau tr?n n??c x?t pho mt. Rau ?o?ng h?p thng th??ng (khng ph?i lo?i t natri ho??c gia?m natri). N??c x?t c chua ?ng h?p va? h?n h?p c chua nho thng th??ng (khng ph?i lo?i i?t natri ho??c gi?m natri). N???c p rau v c chua thng th??ng (khng ph?i lo?i i?t natri ho??c gi?m natri). D?a chua.  liu. Ng? c?c Cc lo?i bnh n??ng c ch?t bo, ch?ng h?n bnh s?ng b, bnh n??ng x?p, ho?c m?t s? lo?i bnh m. M ?ng kh ho?c cc ti b?t g?o. Th?t v cc protein khc Cc la?t thi?t m??. X??ng s??n. Th?t kh. Th?t l?n mu?i xng khi. Xc xch bologna, [place], v cc lo?i th?t n?u s?n ho?c ??p mu?i khc, ch?ng h?n nh? xc xch ho?c bnh m th?t. Ch?t bo ? ph?n l?ng c?a heo (th?t m?). Mn xc xch l?n ?? rn. Qu? h?ch v cc lo?i h?t ??p mu?i. ??u ?ng h?p c b? sung mu?i. C ?ng h?p ho?c hun khi. Nguyn qu? tr?ng ho?c lng ?? tr?ng. G ho?c g ty cn da. S?a S??a nguyn kem ho??c 2%, kem v n?a n? n?a kia. Ph mt nguyn kem ho?c ph mt kem nguyn ch?t bo. S??a chua nguyn ch?t bo ho?c s?a chua co? ????ng. Pho mt nguyn ch?t bo. B?t kem khng s?a. L??p phu? kem ?a? ?a?nh bng. Pho mt ch? bi?n s?n v pho mt ph?t. M? v d?u B?. B? th?c v?t d?ng th?i. M? l?n. M? tr?u (shortening). B? s??a tru. M?? thi?t xng kho?i. D?u nhi?t ??i, ch?ng h?n nh? d?u d?a, d?u h?t c?, ho?c d?u c?. Gia v? Mu?i hnh, mu?i t?i, mu?i nm, mu?i ?n v mu?i bi?n. N???c x?t [place]. N???c x?t tartar. N??c x?t th?t quay. N???c x?t [place]. N??c t??ng, bao g?m loa?i gi?m natri. N??c x?t th?t n??ng. N???c thi?t ?ng h?p v ?ng gi. N??c m?m. D?u ho. N??c x?t cocktail. C?i ng?a mua t?i c?a hng. N??c x?t c chua. M t?t. H??ng li?u th?t v ch?t la?m m?m thi?t. Tho?i b?t canh. T??ng ?t. N??c x?t marinat ch? bi?n s?n ho?c ?ng gi. Gia v? taco ch? bi?n s?n ho?c ?ng gi. ?? gia v?. N???c tr?n sa la?t thng th???ng. Cc th?c ph?m khc B?ng ng v bnh quy m??n. Nh?ng th?c ph?m li?t k ? trn c th? khng  ph?i l m?t danh m?c ??y ?? cc lo?i th?c ph?m v ?? u?ng qu v? nn trnh. Lin h? v?i chuyn gia dinh d??ng ?? c thm thng tin. N?i tm ki?m thm thng tin National Heart, Lung, and Blood Institute (Vi?n Tim, Ph?i v Mu Qu?c gia): [email or web address] American Heart Association (Hi?p h?i Tim m?ch [place]?): www.heart.org Academy of Nutrition and Dietetics (H?c vi?n Dinh d??ng v Ch? ?? ?n): www.eatright.org National Kidney Foundation [place]? Th?n Qu?c Gia): www.kidney.org Tm t?t Ch??ng trnh ?n u?ng DASH l ch??ng trnh ?n u?ng lnh m?nh ? ???c ch?ng minh c tc d?ng lm gi?m huy?t p cao (t?ng huy?t p). N c?ng c th? lm gi?m nguy c? b? ti?u d??ng tup 2, b?nh tim v ??t qu?. Khi ?ang th?c hi?n theo ch??ng trnh ?n u?ng DASH, m?c ?  ch l ?n nhi?u tri cy v rau c? t??i, ng? c?c nguyn h?t, protein th?t n?c, s?a t bo v ch?t bo t?t cho tim h?n. V?i ch??ng trnh ?n u?ng DASH, qu v? c?n ph?i gi?i h?n l??ng mu?i (natri) ? m?c 2.300 mg m?i ngy. N?u b? t?ng huy?t p, qu v? c th? c?n gi?m l??ng dng natri xu?ng ??n m?c 1.500 mg m?i ngy. H?p tc v?i chuyn gia ch?m [place] s?c kh?e ho?c bc s? chuyn khoa dinh d??ng c?a qu v? ?? ?i?u ch?nh ch??ng trnh ?n u?ng ph h?p v?i nhu c?u calo c?a c nhn qu v?. Thng tin ny khng nh?m m?c ?ch thay th? cho l?i khuyn m chuyn gia ch?m [place] s?c kh?e ni v?i qu v?. Hy b?o ??m qu v? ph?i th?o lu?n b?t k? v?n ?? g m qu v? c v?i chuyn gia ch?m [place] s?c kh?e c?a qu v?. Document Revised: 07/04/2019 Document Reviewed: 07/04/2019 Elsevier Patient Education  2022 [employer].

## 2021-05-12 NOTE — Progress Notes (Signed)
Subjective:  Patient ID: Amber Grant, female    DOB: [birth year]  Age: 60 y.o. MRN: [id number]  CC: Hypertension   HPI Amber Grant is a 60 y.o. year old female with a history of hypertension, hyperlipidemia here for chronic disease management.  Interval History: Her blood pressure is elevated and she endorses taking her antihypertensive. States her BP at home is usually around 120 systolic.  Her office blood pressures in the past have always been elevated and she usually attributes this to anxiety. Doing well on her statin with no complaints of myalgias or other additional side effects. She has no additional concerns today. Past Medical History:  Diagnosis Date   Hypertension     History reviewed. No pertinent surgical history.  Family History  Problem Relation Age of Onset   Diabetes Brother    Breast cancer Neg Hx     No Known Allergies  Outpatient Medications Prior to Visit  Medication Sig Dispense Refill   Blood Glucose Monitoring Suppl (TRUE METRIX METER) w/Device KIT Use as directed to check blood sugar in the morning. 1 kit 0   cholecalciferol (VITAMIN D) 1000 UNITS tablet Take 1,000 Units by mouth daily.     glucose blood (TRUE METRIX BLOOD GLUCOSE TEST) test strip Use as instructed to check blood sugar in the morning. 100 each 11   lisinopril (ZESTRIL) 40 MG tablet TAKE 1 TABLET (40 MG TOTAL) BY MOUTH DAILY. 90 tablet 1   Multiple Vitamins-Minerals (MULTIVITAMIN PO) Take by mouth.     TRUEplus Lancets 28G MISC Use to check blood sugar once in the morning. 100 each 11   atorvastatin (LIPITOR) 40 MG tablet TAKE 1 TABLET (40 MG TOTAL) BY MOUTH DAILY. 90 tablet 1   No facility-administered medications prior to visit.     ROS Review of Systems  Constitutional:  Negative for activity change, appetite change and fatigue.  HENT:  Negative for congestion, sinus pressure and sore throat.   Eyes:  Negative for visual disturbance.  Respiratory:  Negative for cough,  chest tightness, shortness of breath and wheezing.   Cardiovascular:  Negative for chest pain and palpitations.  Gastrointestinal:  Negative for abdominal distention, abdominal pain and constipation.  Endocrine: Negative for polydipsia.  Genitourinary:  Negative for dysuria and frequency.  Musculoskeletal:  Negative for arthralgias and back pain.  Skin:  Negative for rash.  Neurological:  Negative for tremors, light-headedness and numbness.  Hematological:  Does not bruise/bleed easily.  Psychiatric/Behavioral:  Negative for agitation and behavioral problems.    Objective:  BP (!) 171/80   Pulse (!) 108   Ht 5' (1.524 m)   Wt 111 lb 9.6 oz (50.6 kg)   SpO2 100%   BMI 21.80 kg/m   BP/Weight 05/12/2021 02/18/2021 11/17/2020  Systolic BP 171 157 163  Diastolic BP 80 79 77  Wt. (Lbs) 111.6 109 110.2  BMI 21.8 21.29 20.82      Physical Exam Constitutional:      Appearance: She is well-developed.  Cardiovascular:     Rate and Rhythm: Tachycardia present.     Heart sounds: Normal heart sounds. No murmur heard. Pulmonary:     Effort: Pulmonary effort is normal.     Breath sounds: Normal breath sounds. No wheezing or rales.  Chest:     Chest wall: No tenderness.  Abdominal:     General: Bowel sounds are normal. There is no distension.     Palpations: Abdomen is soft. There is no mass.  Tenderness: There is no abdominal tenderness.  Musculoskeletal:        General: Normal range of motion.     Right lower leg: No edema.     Left lower leg: No edema.  Neurological:     Mental Status: She is alert and oriented to person, place, and time.  Psychiatric:        Mood and Affect: Mood normal.    CMP Latest Ref Rng & Units 02/18/2021 07/08/2020 06/20/2019  Glucose 65 - 99 mg/dL 95 114(H) 126(H)  BUN 6 - 24 mg/dL $Remove'8 11 8  'hQVPCUE$ Creatinine 0.57 - 1.00 mg/dL 0.66 0.65 0.63  Sodium 134 - 144 mmol/L 134 135 133(L)  Potassium 3.5 - 5.2 mmol/L 4.3 4.3 4.3  Chloride 96 - 106 mmol/L 95(L) 96 96   CO2 20 - 29 mmol/L 16(L) 19(L) 22  Calcium 8.7 - 10.2 mg/dL 9.5 9.5 9.7  Total Protein 6.0 - 8.5 g/dL - 7.7 7.4  Total Bilirubin 0.0 - 1.2 mg/dL - 0.9 0.8  Alkaline Phos 44 - 121 IU/L - 56 70  AST 0 - 40 IU/L - 27 31  ALT 0 - 32 IU/L - 28 40(H)    Lipid Panel     Component Value Date/Time   CHOL 149 07/08/2020 1049   TRIG 60 07/08/2020 1049   HDL 68 07/08/2020 1049   CHOLHDL 2.2 07/08/2020 1049   CHOLHDL 2.4 07/13/2016 0936   VLDL 18 07/13/2016 0936   LDLCALC 69 07/08/2020 1049    CBC    Component Value Date/Time   WBC 4.8 07/13/2016 0936   RBC 4.49 07/13/2016 0936   HGB 13.7 07/13/2016 0936   HCT 41.5 07/13/2016 0936   PLT 292 07/13/2016 0936   MCV 92.4 07/13/2016 0936   MCH 30.5 07/13/2016 0936   MCHC 33.0 07/13/2016 0936   RDW 13.2 07/13/2016 0936   LYMPHSABS 1,920 07/13/2016 0936   MONOABS 192 (L) 07/13/2016 0936   EOSABS 192 07/13/2016 0936   BASOSABS 48 07/13/2016 0936    Lab Results  Component Value Date   HGBA1C 6.2 (H) 02/18/2021    Assessment & Plan:  1. Type 2 diabetes mellitus with other specified complication, without long-term current use of insulin (HCC) Last A1c was 6.2 in 02/2021 This is diet controlled and she only had a single A1c of 6.7 back in 2020 Counseled on Diabetic diet, my plate method, 397 minutes of moderate intensity exercise/week Blood sugar logs with fasting goals of 80-120 mg/dl, random of less than 180 and in the event of sugars less than 60 mg/dl or greater than 400 mg/dl encouraged to notify the clinic. Advised on the need for annual eye exams, annual foot exams, Pneumonia vaccine.  2. Pure hypercholesterolemia Controlled Low-cholesterol diet - atorvastatin (LIPITOR) 40 MG tablet; TAKE 1 TABLET (40 MG TOTAL) BY MOUTH DAILY.  Dispense: 90 tablet; Refill: 1  3. Essential hypertension, benign Uncontrolled Amlodipine added to regimen and she has been advised to bring in her blood pressure log from home to review during her  Pharm.D. visit In the past she has had some dizziness during her antihypertensive regimen was adjusted Continue lisinopril 40 mg - amLODipine (NORVASC) 2.5 MG tablet; Take 1 tablet (2.5 mg total) by mouth daily.  Dispense: 90 tablet; Refill: 1   Meds ordered this encounter  Medications   amLODipine (NORVASC) 2.5 MG tablet    Sig: Take 1 tablet (2.5 mg total) by mouth daily.    Dispense:  90 tablet  Refill:  1   atorvastatin (LIPITOR) 40 MG tablet    Sig: TAKE 1 TABLET (40 MG TOTAL) BY MOUTH DAILY.    Dispense:  90 tablet    Refill:  1    Follow-up: Return in about 2 weeks (around 05/26/2021) for Blood pressure follow-up with Lurena Joiner; 3 months with PCP.       Charlott Rakes, MD, FAAFP. [facility] and [place], [place]   05/12/2021, 12:27 PM

## 2021-05-21 ENCOUNTER — Ambulatory Visit: Payer: No Typology Code available for payment source

## 2021-05-21 ENCOUNTER — Ambulatory Visit: Payer: Medicaid Other | Admitting: Family Medicine

## 2021-05-22 ENCOUNTER — Other Ambulatory Visit: Payer: Self-pay

## 2021-05-22 ENCOUNTER — Ambulatory Visit: Payer: Medicaid Other | Attending: Family Medicine

## 2021-05-28 ENCOUNTER — Ambulatory Visit: Payer: Self-pay | Attending: Family Medicine | Admitting: Pharmacist

## 2021-05-28 ENCOUNTER — Encounter: Payer: Self-pay | Admitting: Pharmacist

## 2021-05-28 ENCOUNTER — Other Ambulatory Visit: Payer: Self-pay

## 2021-05-28 VITALS — BP 156/85 | HR 109

## 2021-05-28 DIAGNOSIS — I10 Essential (primary) hypertension: Secondary | ICD-10-CM

## 2021-05-28 NOTE — Progress Notes (Signed)
S:    PCP: Dr. Alvis Lemmings   Patient arrives in good spirits. Presents to the clinic for hypertension evaluation, counseling, and management. Patient was referred and last seen by Primary Care Provider on 05/12/2021. Pharmacy has seen her before and her BP in clinic is consistently elevated despite goal ambulatory blood pressure. At her PCP visit on 11/29, low dose amlodipine was added as pt's blood pressure was severely elevated in clinic.     Patient reports doing well today. Denies chest pain, dyspnea, HA or blurred vision.   Medication adherence reported with lisinopril. She took lisinopril today. She took amlodipine for 1 or 2 days and tells me this made her nauseous and dizzy so she stopped it. Of note, pt is visibly anxious. She is having issues sitting still in her chair and her hands are trembling noticeably. She endorses heavy anxiety when she comes to any clinic.   Current BP Medications include: amlodipine 2.5 mg daily, lisinopril 40 mg daily  Dietary habits include: not fully compliant with salt restriction; drinks coffee daily  Exercise habits include: walks 1-2 hours daily  Family / Social history:  FHx: DM (brother) Tobacco: never smoker  Alcohol: denies use   O:  Vitals:   05/28/21 0919  BP: (!) 156/85  Pulse: (!) 109     Home BP readings:  11/30 - 120/77 12/1 - 107/79 12/2 - 120/71 12/3 - 116/66 12/4 - 125/68 12/5 - 117/66 12/6 - 114/69 12/7 - 124/71 12/8 - 112/72 12/9 - 117/71 12/10 - 117/68 12/11 - 119/77 12/12 - 115/65 12/13 - 109/75 12/14 - 117/77 12/15 - 123/71   Last 3 Office BP readings: BP Readings from Last 3 Encounters:  05/28/21 (!) 156/85  05/12/21 (!) 171/80  02/18/21 (!) 157/79   BMET    Component Value Date/Time   NA 134 02/18/2021 1158   K 4.3 02/18/2021 1158   CL 95 (L) 02/18/2021 1158   CO2 16 (L) 02/18/2021 1158   GLUCOSE 95 02/18/2021 1158   GLUCOSE 120 (H) 07/13/2016 0936   BUN 8 02/18/2021 1158   CREATININE 0.66  02/18/2021 1158   CREATININE 0.63 07/13/2016 0936   CALCIUM 9.5 02/18/2021 1158   GFRNONAA 98 07/08/2020 1049   GFRNONAA >89 07/13/2016 0936   GFRAA 113 07/08/2020 1049   GFRAA >89 07/13/2016 0936    Renal function: CrCl cannot be calculated (Patient's most recent lab result is older than the maximum 21 days allowed.).  Clinical ASCVD: No  The 10-year ASCVD risk score (Arnett DK, et al., 2019) is: 6.7%   Values used to calculate the score:     Age: 60 years     Sex: Female     Is Non-Hispanic African American: No     Diabetic: Yes     Tobacco smoker: No     Systolic Blood Pressure: 156 mmHg     Is BP treated: Yes     HDL Cholesterol: 68 mg/dL     Total Cholesterol: 149 mg/dL   A/P: Hypertension longstanding currently above goal in clinic, however, all ambulatory data are at goal. Her BP is lower today than at her PCP appt and is reflective of the lisinopril only. BP Goal = < 130/80 mmHg. No changes today.  -Continued current medications.  -Counseled on lifestyle modifications for blood pressure control including reduced dietary sodium, increased exercise, adequate sleep.  Results reviewed and written information provided.   Total time in face-to-face counseling 15 minutes.   F/U  with PCP.   Butch Penny, PharmD, [place], CPP Clinical Pharmacist [facility] [phone]

## 2021-06-10 ENCOUNTER — Other Ambulatory Visit: Payer: Self-pay

## 2021-06-11 ENCOUNTER — Other Ambulatory Visit: Payer: Self-pay

## 2021-07-09 ENCOUNTER — Other Ambulatory Visit: Payer: Self-pay

## 2021-08-11 ENCOUNTER — Encounter: Payer: Self-pay | Admitting: Family Medicine

## 2021-08-11 ENCOUNTER — Other Ambulatory Visit: Payer: Self-pay

## 2021-08-11 ENCOUNTER — Ambulatory Visit: Payer: Self-pay | Attending: Family Medicine | Admitting: Family Medicine

## 2021-08-11 VITALS — BP 155/89 | HR 105 | Ht 60.0 in | Wt 112.6 lb

## 2021-08-11 DIAGNOSIS — I152 Hypertension secondary to endocrine disorders: Secondary | ICD-10-CM

## 2021-08-11 DIAGNOSIS — I10 Essential (primary) hypertension: Secondary | ICD-10-CM

## 2021-08-11 DIAGNOSIS — Z713 Dietary counseling and surveillance: Secondary | ICD-10-CM | POA: Insufficient documentation

## 2021-08-11 DIAGNOSIS — Z7182 Exercise counseling: Secondary | ICD-10-CM | POA: Insufficient documentation

## 2021-08-11 DIAGNOSIS — E785 Hyperlipidemia, unspecified: Secondary | ICD-10-CM | POA: Insufficient documentation

## 2021-08-11 DIAGNOSIS — E1159 Type 2 diabetes mellitus with other circulatory complications: Secondary | ICD-10-CM

## 2021-08-11 DIAGNOSIS — Z79899 Other long term (current) drug therapy: Secondary | ICD-10-CM | POA: Insufficient documentation

## 2021-08-11 DIAGNOSIS — E1169 Type 2 diabetes mellitus with other specified complication: Secondary | ICD-10-CM

## 2021-08-11 LAB — POCT GLYCOSYLATED HEMOGLOBIN (HGB A1C): HbA1c, POC (controlled diabetic range): 6.3 % (ref 0.0–7.0)

## 2021-08-11 LAB — GLUCOSE, POCT (MANUAL RESULT ENTRY): POC Glucose: 123 mg/dl — AB (ref 70–99)

## 2021-08-11 MED ORDER — LISINOPRIL 40 MG PO TABS
ORAL_TABLET | Freq: Every day | ORAL | 1 refills | Status: DC
  Filled 2021-08-11: qty 30, 30d supply, fill #0
  Filled 2021-09-07: qty 30, 30d supply, fill #1
  Filled 2021-10-05: qty 30, 30d supply, fill #2
  Filled 2021-10-28: qty 30, 30d supply, fill #3

## 2021-08-11 MED ORDER — ATORVASTATIN CALCIUM 40 MG PO TABS
40.0000 mg | ORAL_TABLET | Freq: Every day | ORAL | 1 refills | Status: DC
  Filled 2021-08-11: qty 30, 30d supply, fill #0
  Filled 2021-09-07: qty 30, 30d supply, fill #1
  Filled 2021-10-05: qty 30, 30d supply, fill #2
  Filled 2021-10-28: qty 30, 30d supply, fill #3

## 2021-08-11 NOTE — Patient Instructions (Signed)
B?nh ti?u ???ng v ch?m sc bn chn Diabetes Mellitus and Foot Care Ch?m sc bn chn l m?t ph?n quan tr?ng ??i v?i s?c kh?e c?a qu v?, ??c bi?t l khi qu v? b? ti?u ???ng. B?nh ti?u ???ng c th? khi?n qu v? g?p nhi?u v?n ?? v l?u l??ng mu (tu?n hon) km ? bn chn v c?ng chn, ?i?u ny c th? khi?n da c?a qu v?: Tr? nn m?ng h?n v kh h?n. D? n?t h?n. Ch?m lnh h?n. Bong trc v r?n. Qu v? c?ng c th? b? t?n th??ng dy th?n kinh (b?nh th?n kinh) ? c?ng chn v bn chn, gy gi?m c?m gic ? c?ng chn v bn chn ?. ?i?u ny ngh?a l qu v? c th? khng nh?n th?y nh?ng th??ng t?n nh? ? bn chn c kh? n?ng d?n ??n nh?ng v?n ?? nghim tr?ng h?n. Nh?n bi?t v gi?i quy?t s?m b?t k? v?n ?? ti?m ?n no l cch t?t nh?t ?? ng?n ng?a cc v?n ?? trong t??ng lai. Cch ch?m sc bn chn qu v? V? sinh bn chn  R?a bn chn hng ngy b?ng n??c ?m v x phng d?u nh?. Khng s?? du?ng n???c nng. Sau ?, th?m nh? bn chn v cc khu v?c gi?a cc ngn chn cho ??n khi cc vng ? hon ton kh. Khng ngm chn v c th? lm kh da. C?t t?a mng chn th?ng ngang qua. Khng mc bn d??i mng chn ho?c xung quanh l?p bi?u b. Gi?a mp mng b?ng m?t t?m ba c?ng ph? b?t mi ho?c cy gi?a mng. Bi kem d??ng ?m ho?c vaselin vo da bn chn v vo cc mng chn gin, kh. S? d?ng kem d?ng l?ng khng ch?a c?n v khng mi. Khng bi kem d??ng gi?a cc ngn chn. Giy v t?t ?i t?t ho?c bt t?t di s?ch m?i ngy. Hy ch?c ch?n r?ng t?t khng qu ch?t. Khng ?i t?t cao ??n ??u g?i v chng c th? lm gi?m l?u l??ng mu ??n chn qu v?. ?i giy v?a v?n v c ?? l?p ??m. Lun quan st giy tr??c khi qu v? ?i giy ?? ??m b?o khng c v?t g bn trong. ?? ?i v?a giy m?i, hy ?i chng ch? vi gi? m?i ngy. ?i?u ny ng?n ng?a th??ng t?n trn bn chn c?a qu v?. V?t th??ng, v?t tr?y x??c, chai v s?n  Ki?m tra bn chn qu v? hng ngy xem m?n n??c, cc v?t c?t, v?t b?m tm, v?t lot v ?? hay khng. N?u qu  v? khng th? nhn th?y lng bn chn, hy s? d?ng g??ng ho?c nh? ai ? gip ??. Khng c?t cc ch? s?n ho?c chai hay c? g?ng lo?i b? chng b?ng thu?c. N?u qu v? th?y m?t v?t tr?y x??c, v?t c?t ho?c v?t n?t nh? trn da ? bn chn, hy gi? cho bn chn v da xung quanh s?ch v kh. Qu v? c th? lm s?ch nh?ng vng ny b?ng x phng d?u nh? v n??c. Khng lm s?ch vng ? b?ng peroxide, c?n ho?c i-?t. N?u qu v? c v?t th??ng, v?t tr?y x??c, v?t s?n ho?c chai trn bn chn, hy ki?m tra n vi l?n m?i ngy ?? ??m b?o n ?ang lnh l?i v khng b? nhi?m trng. Ki?m tra xem c: ??, s?ng ho?c ?au. Di?ch ho?c mu. ?m. M? ho?c mi hi. L??i khuyn chung Khng b?t cho chn. ?i?u ny c th? lm gi?m l?u l??ng mu ??n bn chn qu v?. Khng s?   d?ng cc mi?ng ??m gia nhi?t ho?c chai n??c nng trn bn chn qu v?. Cc ?? v?t ny c th? lm b?ng da qu v?. N?u qu v? m?t c?m gic ? bn chn ho?c c?ng chn, qu v? c th? khng bi?t ?i?u ny ?ang x?y ra cho ??n khi qu mu?n. B?o v? chn qu v? kh?i nng v l?nh b?ng cch ?i giy, ch?ng h?n nh? ? bi bi?n ho?c trn v?a h nng. Ln l?ch khm bn chn ton di?n t nh?t m?t l?n m?i n?m (hng n?m) ho?c th??ng xuyn h?n n?u qu v? c v?n ?? v? bn chn. Bo co ngay l?p t?c b?t k? v?t c?t, v?t lot ho?c v?t b?m tm no cho chuyn gia ch?m sc s?c kh?e. N?i tm thm thng tin American Diabetes Association (Hi?p h?i Ti?u ????ng My?): www.diabetes.org Association of Diabetes Care & Education Specialists (Hi?p h?i Chuyn gia Gio d?c Ch?m sc B?nh ti?u ???ng): www.diabeteseducator.org Hy lin l?c v?i chuyn gia ch?m sc s?c kh?e n?u: Qu v? c m?t tnh tr?ng b?nh l lm t?ng nguy c? nhi?m trng v qu v? c b?t k? v?t c?t, v?t lot ho?c v?t b?m tm no trn bn chn. Qu v? b? m?t th??ng t?n khng lnh. Qu v? b? t?y ?? ? c?ng chn ho?c bn chn. Qu v? c?m th?y nng rt ho?c ?au bu?t ? c?ng chn ho?c bn chn. Qu v? b? ?au ho?c co th?t ? c?ng chn v bn  chn. C?ng chn ho?c bn chn qu v? b? t. Bn chn c?a qu v? lun c?m th?y l?nh. Qu v? b? ?au xung quanh b?t k? mng chn no. Yu c?u tr? gip ngay l?p t?c n?u: Qu v? c m?t v?t th??ng, tr?y x??c, s?n ho?c chai trn bn chn v: Qu v? b? ?au, s?ng ho?c t?y ?? tr?m tr?ng h?n. Qu v? c d?ch ho?c mu ch?y ra ? v?t th??ng, v?t tr?y x??c, v?t s?n ho?c chai. V?t th??ng, v?t tr?y x??c, v?t s?n ho?c chai c?a qu v? c?m th?y ?m nng khi ch?m vo. Qu v? c m? ho?c mi hi pht ra ? v?t th??ng, v?t tr?y x??c, v?t s?n ho?c chai. Qu v? b? s?t. Qu v? c m?t ???ng mu ?? ch?y ln c?ng chn. Tm t?t Hy ki?m tra bn chn qu v? hng ngy xem c cc m?n n??c, cc v?t c?t, v?t b?m tm, ch? lot v ?? hay khng. Bi kem d??ng ?m ho?c vaselin vo da bn chn v vo cc mng chn gin, kh. ?i giy v?a v?n v c ?? l?p ??m. N?u qu v? c v?n ?? v? bn chn, hy bo co ngay l?p t?c b?t k? v?t c?t, v?t lot ho?c v?t b?m tm no cho chuyn gia ch?m sc s?c kh?e. Ln l?ch khm bn chn ton di?n t nh?t m?t l?n m?i n?m (hng n?m) ho?c th??ng xuyn h?n n?u qu v? c v?n ?? v? bn chn. Thng tin ny khng nh?m m?c ?ch thay th? cho l?i khuyn m chuyn gia ch?m sc s?c kh?e ni v?i qu v?. Hy b?o ??m qu v? ph?i th?o lu?n b?t k? v?n ?? g m qu v? c v?i chuyn gia ch?m sc s?c kh?e c?a qu v?. Document Revised: 01/21/2020 Document Reviewed: 01/21/2020 Elsevier Patient Education  2022 Elsevier Inc.  

## 2021-08-11 NOTE — Progress Notes (Signed)
Subjective:  Patient ID: Amber Grant, female    DOB: Jun 26, 1960  Age: 61 y.o. MRN: [id number]  CC: Diabetes   HPI Amber Saxon My Breed is a 61 y.o. year old female with a history of hypertension, hyperlipidemia-type 2 diabetes mellitus (A1c 6.3) here for chronic disease management.  Interval History: Her blood pressure is elevated and she has had visits with the clinical pharmacist for evaluation of her blood pressure.  She endorses presence of whitecoat hypertension as she is states blood pressures at home have been normal. BP log reveals 113-125/65-78.  For her diabetes she is on diet control and her fasting sugars have been around 120.  She denies hypoglycemia, numbness in extremities or visual concerns.  She is not up-to-date on annual eye exams  Compliant with her statin. She is accompanied by her husband to today's visit and has no additional concerns.  Past Medical History:  Diagnosis Date   Hypertension     No past surgical history on file.  Family History  Problem Relation Age of Onset   Diabetes Brother    Breast cancer Neg Hx     No Known Allergies  Outpatient Medications Prior to Visit  Medication Sig Dispense Refill   atorvastatin (LIPITOR) 40 MG tablet TAKE 1 TABLET (40 MG TOTAL) BY MOUTH DAILY. 90 tablet 1   Blood Glucose Monitoring Suppl (TRUE METRIX METER) w/Device KIT Use as directed to check blood sugar in the morning. 1 kit 0   cholecalciferol (VITAMIN D) 1000 UNITS tablet Take 1,000 Units by mouth daily.     glucose blood (TRUE METRIX BLOOD GLUCOSE TEST) test strip Use as instructed to check blood sugar in the morning. 100 each 11   lisinopril (ZESTRIL) 40 MG tablet TAKE 1 TABLET (40 MG TOTAL) BY MOUTH DAILY. 90 tablet 1   Multiple Vitamins-Minerals (MULTIVITAMIN PO) Take by mouth.     TRUEplus Lancets 28G MISC Use to check blood sugar once in the morning. 100 each 11   No facility-administered medications prior to visit.     ROS Review of Systems   Constitutional:  Negative for activity change, appetite change and fatigue.  HENT:  Negative for congestion, sinus pressure and sore throat.   Eyes:  Negative for visual disturbance.  Respiratory:  Negative for cough, chest tightness, shortness of breath and wheezing.   Cardiovascular:  Negative for chest pain and palpitations.  Gastrointestinal:  Negative for abdominal distention, abdominal pain and constipation.  Endocrine: Negative for polydipsia.  Genitourinary:  Negative for dysuria and frequency.  Musculoskeletal:  Negative for arthralgias and back pain.  Skin:  Negative for rash.  Neurological:  Negative for tremors, light-headedness and numbness.  Hematological:  Does not bruise/bleed easily.  Psychiatric/Behavioral:  Negative for agitation and behavioral problems.    Objective:  BP (!) 155/89    Pulse (!) 105    Ht 5' (1.524 m)    Wt 112 lb 9.6 oz (51.1 kg)    SpO2 100%    BMI 21.99 kg/m   BP/Weight 08/11/2021 05/28/2021 94/80/1655  Systolic BP 374 827 078  Diastolic BP 89 85 80  Wt. (Lbs) 112.6 - 111.6  BMI 21.99 - 21.8      Physical Exam Constitutional:      Appearance: She is well-developed.  Cardiovascular:     Rate and Rhythm: Tachycardia present.     Heart sounds: Normal heart sounds. No murmur heard. Pulmonary:     Effort: Pulmonary effort is normal.  Breath sounds: Normal breath sounds. No wheezing or rales.  Chest:     Chest wall: No tenderness.  Abdominal:     General: Bowel sounds are normal. There is no distension.     Palpations: Abdomen is soft. There is no mass.     Tenderness: There is no abdominal tenderness.  Musculoskeletal:        General: Normal range of motion.     Right lower leg: No edema.     Left lower leg: No edema.  Neurological:     Mental Status: She is alert and oriented to person, place, and time.  Psychiatric:        Mood and Affect: Mood normal.   Diabetic Foot Exam - Simple   Simple Foot Form Diabetic Foot exam was  performed with the following findings: Yes 08/11/2021  9:59 AM  Visual Inspection No deformities, no ulcerations, no other skin breakdown bilaterally: Yes Sensation Testing Intact to touch and monofilament testing bilaterally: Yes Pulse Check Posterior Tibialis and Dorsalis pulse intact bilaterally: Yes Comments     CMP Latest Ref Rng & Units 02/18/2021 07/08/2020 06/20/2019  Glucose 65 - 99 mg/dL 95 114(H) 126(H)  BUN 6 - 24 mg/dL $Remove'8 11 8  'NILmJPf$ Creatinine 0.57 - 1.00 mg/dL 0.66 0.65 0.63  Sodium 134 - 144 mmol/L 134 135 133(L)  Potassium 3.5 - 5.2 mmol/L 4.3 4.3 4.3  Chloride 96 - 106 mmol/L 95(L) 96 96  CO2 20 - 29 mmol/L 16(L) 19(L) 22  Calcium 8.7 - 10.2 mg/dL 9.5 9.5 9.7  Total Protein 6.0 - 8.5 g/dL - 7.7 7.4  Total Bilirubin 0.0 - 1.2 mg/dL - 0.9 0.8  Alkaline Phos 44 - 121 IU/L - 56 70  AST 0 - 40 IU/L - 27 31  ALT 0 - 32 IU/L - 28 40(H)    Lipid Panel     Component Value Date/Time   CHOL 149 07/08/2020 1049   TRIG 60 07/08/2020 1049   HDL 68 07/08/2020 1049   CHOLHDL 2.2 07/08/2020 1049   CHOLHDL 2.4 07/13/2016 0936   VLDL 18 07/13/2016 0936   LDLCALC 69 07/08/2020 1049    CBC    Component Value Date/Time   WBC 4.8 07/13/2016 0936   RBC 4.49 07/13/2016 0936   HGB 13.7 07/13/2016 0936   HCT 41.5 07/13/2016 0936   PLT 292 07/13/2016 0936   MCV 92.4 07/13/2016 0936   MCH 30.5 07/13/2016 0936   MCHC 33.0 07/13/2016 0936   RDW 13.2 07/13/2016 0936   LYMPHSABS 1,920 07/13/2016 0936   MONOABS 192 (L) 07/13/2016 0936   EOSABS 192 07/13/2016 0936   BASOSABS 48 07/13/2016 0936    Lab Results  Component Value Date   HGBA1C 6.3 08/11/2021    Assessment & Plan:  1. Type 2 diabetes mellitus with other specified complication, without long-term current use of insulin (HCC) Diet controlled with A1c of 6.3; goal is less than 7.0 Continue current regimen Counseled on Diabetic diet, my plate method, 361 minutes of moderate intensity exercise/week Blood sugar logs with  fasting goals of 80-120 mg/dl, random of less than 180 and in the event of sugars less than 60 mg/dl or greater than 400 mg/dl encouraged to notify the clinic. Advised on the need for annual eye exams, annual foot exams, Pneumonia vaccine. - POCT glucose (manual entry) - POCT glycosylated hemoglobin (Hb A1C) - Ambulatory referral to Ophthalmology - Microalbumin / creatinine urine ratio - LP+Non-HDL Cholesterol - CMP14+EGFR  2. Hyperlipidemia associated  with type 2 diabetes mellitus (HCC) Controlled Low-cholesterol diet - atorvastatin (LIPITOR) 40 MG tablet; TAKE 1 TABLET (40 MG TOTAL) BY MOUTH DAILY.  Dispense: 90 tablet; Refill: 1  3. White coat syndrome with diagnosis of hypertension Uncontrolled Blood pressure log which I reviewed today has been normal No regimen change - lisinopril (ZESTRIL) 40 MG tablet; TAKE 1 TABLET (40 MG TOTAL) BY MOUTH DAILY.  Dispense: 90 tablet; Refill: 1  4. Hypertension associated with diabetes ([place]) See #3 above Counseled on blood pressure goal of less than 130/80, low-sodium, DASH diet, medication compliance, 150 minutes of moderate intensity exercise per week. Discussed medication compliance, adverse effects. - lisinopril (ZESTRIL) 40 MG tablet; TAKE 1 TABLET (40 MG TOTAL) BY MOUTH DAILY.  Dispense: 90 tablet; Refill: 1  No orders of the defined types were placed in this encounter.   Follow-up: Return in about 3 months (around 11/08/2021) for Chronic medical conditions.       Charlott Rakes, MD, FAAFP. [facility] and [place], [place]   08/11/2021, 10:01 AM

## 2021-08-12 LAB — CMP14+EGFR
ALT: 28 IU/L (ref 0–32)
AST: 28 IU/L (ref 0–40)
Albumin/Globulin Ratio: 1.7 (ref 1.2–2.2)
Albumin: 5 g/dL — ABNORMAL HIGH (ref 3.8–4.9)
Alkaline Phosphatase: 67 IU/L (ref 44–121)
BUN/Creatinine Ratio: 22 (ref 9–23)
BUN: 13 mg/dL (ref 6–24)
Bilirubin Total: 0.7 mg/dL (ref 0.0–1.2)
CO2: 22 mmol/L (ref 20–29)
Calcium: 9.7 mg/dL (ref 8.7–10.2)
Chloride: 97 mmol/L (ref 96–106)
Creatinine, Ser: 0.6 mg/dL (ref 0.57–1.00)
Globulin, Total: 2.9 g/dL (ref 1.5–4.5)
Glucose: 128 mg/dL — ABNORMAL HIGH (ref 70–99)
Potassium: 4.9 mmol/L (ref 3.5–5.2)
Sodium: 134 mmol/L (ref 134–144)
Total Protein: 7.9 g/dL (ref 6.0–8.5)
eGFR: 103 mL/min/{1.73_m2} (ref >59)

## 2021-08-12 LAB — MICROALBUMIN / CREATININE URINE RATIO
Creatinine, Urine: 20.3 mg/dL
Microalb/Creat Ratio: 16 mg/g creat (ref 0–29)
Microalbumin, Urine: 3.2 ug/mL

## 2021-08-12 LAB — LP+NON-HDL CHOLESTEROL
Cholesterol, Total: 131 mg/dL (ref 100–199)
HDL: 59 mg/dL (ref >39)
LDL Chol Calc (NIH): 61 mg/dL (ref 0–99)
Total Non-HDL-Chol (LDL+VLDL): 72 mg/dL (ref 0–129)
Triglycerides: 49 mg/dL (ref 0–149)
VLDL Cholesterol Cal: 11 mg/dL (ref 5–40)

## 2021-08-13 ENCOUNTER — Telehealth: Payer: Self-pay

## 2021-08-13 NOTE — Telephone Encounter (Signed)
-----   Message from Hoy Register, MD sent at 08/13/2021 12:38 PM EST ----- ?Please inform the patient that labs are normal. Thank you. ?

## 2021-08-13 NOTE — Telephone Encounter (Signed)
Patient name and DOB has been verified Patient was informed of lab results. Patient had no questions.  

## 2021-09-07 ENCOUNTER — Other Ambulatory Visit: Payer: Self-pay

## 2021-10-05 ENCOUNTER — Other Ambulatory Visit: Payer: Self-pay

## 2021-10-28 ENCOUNTER — Other Ambulatory Visit: Payer: Self-pay

## 2021-10-29 ENCOUNTER — Other Ambulatory Visit: Payer: Self-pay

## 2021-11-16 ENCOUNTER — Ambulatory Visit: Payer: No Typology Code available for payment source | Attending: Family Medicine | Admitting: Family Medicine

## 2021-11-16 ENCOUNTER — Other Ambulatory Visit: Payer: Self-pay | Admitting: Family Medicine

## 2021-11-16 ENCOUNTER — Other Ambulatory Visit: Payer: Self-pay

## 2021-11-16 ENCOUNTER — Encounter: Payer: Self-pay | Admitting: Family Medicine

## 2021-11-16 VITALS — BP 173/85 | HR 108 | Wt 113.0 lb

## 2021-11-16 DIAGNOSIS — E1169 Type 2 diabetes mellitus with other specified complication: Secondary | ICD-10-CM

## 2021-11-16 DIAGNOSIS — E1159 Type 2 diabetes mellitus with other circulatory complications: Secondary | ICD-10-CM

## 2021-11-16 DIAGNOSIS — I10 Essential (primary) hypertension: Secondary | ICD-10-CM

## 2021-11-16 DIAGNOSIS — E785 Hyperlipidemia, unspecified: Secondary | ICD-10-CM

## 2021-11-16 DIAGNOSIS — I152 Hypertension secondary to endocrine disorders: Secondary | ICD-10-CM

## 2021-11-16 MED ORDER — LISINOPRIL 40 MG PO TABS
ORAL_TABLET | Freq: Every day | ORAL | 1 refills | Status: DC
  Filled 2021-11-16: qty 90, fill #0
  Filled 2021-11-25: qty 30, 30d supply, fill #0
  Filled 2021-12-21: qty 30, 30d supply, fill #1
  Filled 2022-01-25: qty 30, 30d supply, fill #2
  Filled 2022-02-22: qty 30, 30d supply, fill #3
  Filled 2022-03-15 – 2022-03-17 (×2): qty 30, 30d supply, fill #4
  Filled 2022-04-15: qty 30, 30d supply, fill #5

## 2021-11-16 MED ORDER — ATORVASTATIN CALCIUM 40 MG PO TABS
40.0000 mg | ORAL_TABLET | Freq: Every day | ORAL | 1 refills | Status: DC
  Filled 2021-11-16 – 2021-11-25 (×2): qty 90, 90d supply, fill #0
  Filled 2022-02-22: qty 90, 90d supply, fill #1

## 2021-11-16 MED ORDER — TRUEPLUS LANCETS 28G MISC
11 refills | Status: AC
  Filled 2021-11-16: qty 100, 90d supply, fill #0

## 2021-11-16 NOTE — Patient Instructions (Signed)

## 2021-11-16 NOTE — Progress Notes (Signed)
Subjective:  Patient ID: Amber Grant, female    DOB: April 09, 1961  Age: 61 y.o. MRN: [id number]  CC: Hypertension   HPI Amber Grant is a 61 y.o. year old female with a history of  hypertension, whitecoat hypertension, hyperlipidemia-type 2 diabetes mellitus (A1c 6.3) here for chronic disease management.  Interval History: She brings in her BP log which reveals BP range of 114-126/63-73 but her blood pressure in the clinic is elevated.  Her blood pressures are usually elevated at her office visits and she attributes this to anxiety. Endorses adherence with her antihypertensive and also with her statin. She is on diet control for her diabetes. She has no medical coverage and complains that her financial assistance has expired and she is unable to renew her application due to the fact that financial assistance office relocated. Denies additional concerns today.  Past Medical History:  Diagnosis Date   Hypertension     No past surgical history on file.  Family History  Problem Relation Age of Onset   Diabetes Brother    Breast cancer Neg Hx     Social History   Socioeconomic History   Marital status: Single    Spouse name: Not on file   Number of children: Not on file   Years of education: Not on file   Highest education level: Not on file  Occupational History   Not on file  Tobacco Use   Smoking status: Never   Smokeless tobacco: Never  Vaping Use   Vaping Use: Never used  Substance and Sexual Activity   Alcohol use: No   Drug use: No   Sexual activity: Not Currently  Other Topics Concern   Not on file  Social History Narrative   Not on file   Social Determinants of Health   Financial Resource Strain: Not on file  Food Insecurity: Not on file  Transportation Needs: Not on file  Physical Activity: Not on file  Stress: Not on file  Social Connections: Not on file    No Known Allergies  Outpatient Medications Prior to Visit  Medication Sig Dispense  Refill   atorvastatin (LIPITOR) 40 MG tablet TAKE 1 TABLET (40 MG TOTAL) BY MOUTH DAILY. 90 tablet 1   Blood Glucose Monitoring Suppl (TRUE METRIX METER) w/Device KIT Use as directed to check blood sugar in the morning. 1 kit 0   cholecalciferol (VITAMIN D) 1000 UNITS tablet Take 1,000 Units by mouth daily.     glucose blood (TRUE METRIX BLOOD GLUCOSE TEST) test strip Use as instructed to check blood sugar in the morning. 100 each 11   lisinopril (ZESTRIL) 40 MG tablet TAKE 1 TABLET (40 MG TOTAL) BY MOUTH DAILY. 90 tablet 1   Multiple Vitamins-Minerals (MULTIVITAMIN PO) Take by mouth.     TRUEplus Lancets 28G MISC Use to check blood sugar once in the morning. 100 each 11   No facility-administered medications prior to visit.     ROS Review of Systems  Constitutional:  Negative for activity change, appetite change and fatigue.  HENT:  Negative for congestion, sinus pressure and sore throat.   Eyes:  Negative for visual disturbance.  Respiratory:  Negative for cough, chest tightness, shortness of breath and wheezing.   Cardiovascular:  Negative for chest pain and palpitations.  Gastrointestinal:  Negative for abdominal distention, abdominal pain and constipation.  Endocrine: Negative for polydipsia.  Genitourinary:  Negative for dysuria and frequency.  Musculoskeletal:  Negative for arthralgias and back pain.  Skin:  Negative for rash.  Neurological:  Negative for tremors, light-headedness and numbness.  Hematological:  Does not bruise/bleed easily.  Psychiatric/Behavioral:  Negative for agitation and behavioral problems.    Objective:  BP (!) 173/85   Pulse (!) 108   Wt 113 lb (51.3 kg)   SpO2 100%   BMI 22.07 kg/m      11/16/2021   10:45 AM 08/11/2021    9:44 AM 05/28/2021    9:19 AM  BP/Weight  Systolic BP 210 312 811  Diastolic BP 85 89 85  Wt. (Lbs) 113 112.6   BMI 22.07 kg/m2 21.99 kg/m2       Physical Exam Constitutional:      Appearance: She is well-developed.   Cardiovascular:     Rate and Rhythm: Tachycardia present.     Heart sounds: Normal heart sounds. No murmur heard. Pulmonary:     Effort: Pulmonary effort is normal.     Breath sounds: Normal breath sounds. No wheezing or rales.  Chest:     Chest wall: No tenderness.  Abdominal:     General: Bowel sounds are normal. There is no distension.     Palpations: Abdomen is soft. There is no mass.     Tenderness: There is no abdominal tenderness.  Musculoskeletal:        General: Normal range of motion.     Right lower leg: No edema.     Left lower leg: No edema.  Neurological:     Mental Status: She is alert and oriented to person, place, and time.  Psychiatric:        Mood and Affect: Mood normal.       Latest Ref Rng & Units 08/11/2021   10:10 AM 02/18/2021   11:58 AM 07/08/2020   10:49 AM  CMP  Glucose 70 - 99 mg/dL 128   95   114    BUN 6 - 24 mg/dL _0 Creatinine 0.57 - 1.00 mg/dL 0.60   0.66   0.65    Sodium 134 - 144 mmol/L 134   134   135    Potassium 3.5 - 5.2 mmol/L 4.9   4.3   4.3    Chloride 96 - 106 mmol/L 97   95   96    CO2 20 - 29 mmol/L _1 Calcium 8.7 - 10.2 mg/dL 9.7   9.5   9.5    Total Protein 6.0 - 8.5 g/dL 7.9    7.7    Total Bilirubin 0.0 - 1.2 mg/dL 0.7    0.9    Alkaline Phos 44 - 121 IU/L 67    56    AST 0 - 40 IU/L 28    27    ALT 0 - 32 IU/L 28    28      Lipid Panel     Component Value Date/Time   CHOL 131 08/11/2021 1010   TRIG 49 08/11/2021 1010   HDL 59 08/11/2021 1010   CHOLHDL 2.2 07/08/2020 1049   CHOLHDL 2.4 07/13/2016 0936   VLDL 18 07/13/2016 0936   LDLCALC 61 08/11/2021 1010    CBC    Component Value Date/Time   WBC 4.8 07/13/2016 0936   RBC 4.49 07/13/2016 0936   HGB 13.7 07/13/2016 0936   HCT 41.5 07/13/2016 0936   PLT 292 07/13/2016 0936   MCV 92.4 07/13/2016 0936   MCH  30.5 07/13/2016 0936   MCHC 33.0 07/13/2016 0936   RDW 13.2 07/13/2016 0936   LYMPHSABS 1,920 07/13/2016 0936   MONOABS 192  (L) 07/13/2016 0936   EOSABS 192 07/13/2016 0936   BASOSABS 48 07/13/2016 0936    Lab Results  Component Value Date   HGBA1C 6.3 08/11/2021    Assessment & Plan:  1. Type 2 diabetes mellitus with other specified complication, without long-term current use of insulin (HCC) Diet controlled with A1c of 6.3 We will check A1c at next visit as she is hesitant to have A1c done today due to the fact that her financial assistance has expired Advised to stop by the front desk to obtain paperwork for financial assistance Counseled on Diabetic diet, my plate method, 993 minutes of moderate intensity exercise/week Blood sugar logs with fasting goals of 80-120 mg/dl, random of less than 180 and in the event of sugars less than 60 mg/dl or greater than 400 mg/dl encouraged to notify the clinic. Advised on the need for annual eye exams, annual foot exams, Pneumonia vaccine. - TRUEplus Lancets 28G MISC; Use to check blood sugar once in the morning.  Dispense: 100 each; Refill: 11  2. White coat syndrome with diagnosis of hypertension Uncontrolled Advised to bring in her blood pressure monitor at her next visit so I can compare with blood pressure monitor in the office No regimen changes ambulatory blood pressure readings are normal Counseled on Diabetic diet, my plate method, 570 minutes of moderate intensity exercise/week Blood sugar logs with fasting goals of 80-120 mg/dl, random of less than 180 and in the event of sugars less than 60 mg/dl or greater than 400 mg/dl encouraged to notify the clinic. Advised on the need for annual eye exams, annual foot exams, Pneumonia vaccine. - lisinopril (ZESTRIL) 40 MG tablet; TAKE 1 TABLET (40 MG TOTAL) BY MOUTH DAILY.  Dispense: 90 tablet; Refill: 1  3. Hyperlipidemia associated with type 2 diabetes mellitus (HCC) Controlled Low-cholesterol diet - atorvastatin (LIPITOR) 40 MG tablet; TAKE 1 TABLET (40 MG TOTAL) BY MOUTH DAILY.  Dispense: 90 tablet; Refill:  1  4. Hypertension associated with diabetes ([place]) See #2 above - lisinopril (ZESTRIL) 40 MG tablet; TAKE 1 TABLET (40 MG TOTAL) BY MOUTH DAILY.  Dispense: 90 tablet; Refill: 1    No orders of the defined types were placed in this encounter.   Follow-up: Return in about 3 months (around 02/16/2022) for Chronic medical conditions.       Charlott Rakes, MD, FAAFP. [facility] and [place], [place]   11/16/2021, 10:52 AM

## 2021-11-25 ENCOUNTER — Other Ambulatory Visit: Payer: Self-pay

## 2021-12-21 ENCOUNTER — Other Ambulatory Visit: Payer: Self-pay

## 2022-01-25 ENCOUNTER — Other Ambulatory Visit: Payer: Self-pay

## 2022-01-26 ENCOUNTER — Other Ambulatory Visit: Payer: Self-pay

## 2022-02-22 ENCOUNTER — Other Ambulatory Visit: Payer: Self-pay

## 2022-03-02 ENCOUNTER — Ambulatory Visit: Payer: No Typology Code available for payment source | Admitting: Family Medicine

## 2022-03-15 ENCOUNTER — Other Ambulatory Visit: Payer: Self-pay

## 2022-03-17 ENCOUNTER — Other Ambulatory Visit: Payer: Self-pay

## 2022-03-29 ENCOUNTER — Ambulatory Visit: Payer: No Typology Code available for payment source | Admitting: Family Medicine

## 2022-04-15 ENCOUNTER — Other Ambulatory Visit: Payer: Self-pay

## 2022-04-15 ENCOUNTER — Other Ambulatory Visit: Payer: Self-pay | Admitting: Family Medicine

## 2022-04-15 DIAGNOSIS — E1169 Type 2 diabetes mellitus with other specified complication: Secondary | ICD-10-CM

## 2022-04-15 NOTE — Telephone Encounter (Signed)
Rx 11/16/21 #90 1RF- too soon Requested Prescriptions  Pending Prescriptions Disp Refills   atorvastatin (LIPITOR) 40 MG tablet 90 tablet 1    Sig: TAKE 1 TABLET (40 MG TOTAL) BY MOUTH DAILY.     Cardiovascular:  Antilipid - Statins Failed - 04/15/2022  3:26 PM      Failed - Lipid Panel in normal range within the last 12 months    Cholesterol, Total  Date Value Ref Range Status  08/11/2021 131 100 - 199 mg/dL Final   LDL Chol Calc (NIH)  Date Value Ref Range Status  08/11/2021 61 0 - 99 mg/dL Final   HDL  Date Value Ref Range Status  08/11/2021 59 >39 mg/dL Final   Triglycerides  Date Value Ref Range Status  08/11/2021 49 0 - 149 mg/dL Final         Passed - Patient is not pregnant      Passed - Valid encounter within last 12 months    Recent Outpatient Visits           5 months ago Type 2 diabetes mellitus with other specified complication, without long-term current use of insulin ([place])   [place], Elida, MD   8 months ago Type 2 diabetes mellitus with other specified complication, without long-term current use of insulin ([place])   [place], Wilburn, MD   10 months ago White coat syndrome with diagnosis of hypertension   [place], Annie Main L, RPH-CPP   11 months ago Type 2 diabetes mellitus with other specified complication, without long-term current use of insulin ([place])   [place], Charlane Ferretti, MD   1 year ago White coat syndrome with diagnosis of hypertension   [place], Enobong, MD       Future Appointments             In 1 week Charlott Rakes, MD [place]

## 2022-04-20 ENCOUNTER — Other Ambulatory Visit: Payer: Self-pay

## 2022-04-26 ENCOUNTER — Encounter: Payer: Self-pay | Admitting: Family Medicine

## 2022-04-26 ENCOUNTER — Other Ambulatory Visit: Payer: Self-pay

## 2022-04-26 ENCOUNTER — Ambulatory Visit: Payer: No Typology Code available for payment source | Attending: Family Medicine | Admitting: Family Medicine

## 2022-04-26 VITALS — BP 160/88 | HR 113 | Ht 60.0 in | Wt 115.6 lb

## 2022-04-26 DIAGNOSIS — E1159 Type 2 diabetes mellitus with other circulatory complications: Secondary | ICD-10-CM

## 2022-04-26 DIAGNOSIS — I152 Hypertension secondary to endocrine disorders: Secondary | ICD-10-CM

## 2022-04-26 DIAGNOSIS — Z1211 Encounter for screening for malignant neoplasm of colon: Secondary | ICD-10-CM

## 2022-04-26 DIAGNOSIS — E1169 Type 2 diabetes mellitus with other specified complication: Secondary | ICD-10-CM

## 2022-04-26 DIAGNOSIS — I10 Essential (primary) hypertension: Secondary | ICD-10-CM

## 2022-04-26 DIAGNOSIS — E785 Hyperlipidemia, unspecified: Secondary | ICD-10-CM

## 2022-04-26 LAB — POCT GLYCOSYLATED HEMOGLOBIN (HGB A1C): HbA1c, POC (controlled diabetic range): 6.3 % (ref 0.0–7.0)

## 2022-04-26 LAB — GLUCOSE, POCT (MANUAL RESULT ENTRY): POC Glucose: 126 mg/dl — AB (ref 70–99)

## 2022-04-26 MED ORDER — ZOSTER VAC RECOMB ADJUVANTED 50 MCG/0.5ML IM SUSR
0.5000 mL | Freq: Once | INTRAMUSCULAR | 0 refills | Status: AC
  Filled 2022-05-31: qty 1, 1d supply, fill #0

## 2022-04-26 MED ORDER — ATORVASTATIN CALCIUM 40 MG PO TABS
40.0000 mg | ORAL_TABLET | Freq: Every day | ORAL | 1 refills | Status: DC
  Filled 2022-04-26 – 2022-05-17 (×2): qty 90, 90d supply, fill #0
  Filled 2022-08-12: qty 90, 90d supply, fill #1

## 2022-04-26 MED ORDER — LISINOPRIL 40 MG PO TABS
ORAL_TABLET | Freq: Every day | ORAL | 1 refills | Status: DC
  Filled 2022-04-26: qty 90, fill #0
  Filled 2022-05-17: qty 90, 90d supply, fill #0
  Filled 2022-08-12: qty 90, 90d supply, fill #1

## 2022-04-26 NOTE — Progress Notes (Signed)
Patient stated that she want a 3 month refill on medication instead of one month

## 2022-04-26 NOTE — Patient Instructions (Signed)
Ch??ng trnh ?n u?ng DASH DASH Eating Plan DASH l vi?t t?t c?a Dietary Approaches to Stop Hypertension, ngh?a l Ph??ng php ti?p c?n ch? ?? ?n u?ng ?? lm gi?m huy?t p. Ch??ng trnh ?n u?ng DASH l ch??ng trnh ?n u?ng lnh m?nh ? ???c ch?ng minh c tc d?ng: Gi?m huy?t p cao (t?ng huy?t p). Gi?m nguy c? b? ti?u ???ng tup 2, b?nh tim v ??t qu?. Gip gi?m cn. C nh?ng l?i khuyn no ?? tun th? k? ho?ch ny? ??c nhn th?c ph?m Ki?m tra nhn th?c ph?m ?? bi?t l??ng mu?i (natri) trn m?i kh?u ph?n. Ch?n nh?ng th?c ph?m c d??i 5 ph?n tr?m L??ng natri hng ngy. Ni chung, nh?ng th?c ph?m c d??i 300 mg natri m?i kh?u ph?n ph h?p ?? ??a vo ch??ng trnh ?n u?ng ny. ?? tm cc lo?i ng? c?c nguyn h?t, hy ki?m t? "nguyn h?t", l t? ??u tin trong danh sch thnh ph?n. Mua s?m Mua cc s?n ph?m dn nhn "natri th?p" ho?c "khng thm mu?i." Mua th?c ph?m t??i. Trnh nh?ng th?c ph?m ?ng h?p v cc mn ?n ch? bi?n s?n ho?c ?ng l?nh. N?u n??ng Trnh thm mu?i khi n?u ?n. S? d?ng gia v? khng co? mu?i ho?c th?o d??c thay v mu?i ?n ho?c mu?i bi?n. Ki?m tra v?i chuyn gia ch?m [place] s?c kh?e ho??c d???c sy? c?a quy? vi? tr??c khi s? d?ng cc s?n ph?m thay th? mu?i. Khng chin/rn ?? ?n. N?u ?? ?n b?ng nh?ng ph??ng php c l?i cho s?c kh?e nh? b? l, lu?c, n??ng v quay v hun nng. N?u ?n b?ng lo?i d?u t?t cho tim, ch?ng h?n nh? d?u  liu, d?u c?i, d?u b?, ??u nnh ho?c h??ng d??ng. Ln k? ho?ch cho b?a ?n  ?n ch? ?? ?n cn ??i bao g?m: T? 4 kh?u ph?n tri cy v 4 kh?u ph?n rau c? tr? ln m?i ngy. C? g?ng dnh m?t n?a ??a cho tri cy v rau c?. 6-8 kh?u ph?n ng? c?c nguyn cm m?i ngy. D??i 6 ao-x? (170 g) th?t n?c, th?t gia c?m ho?c c m?i ngy. M?t kh?u ph?n 3 ao-x? (85 g) th?t t??ng ???ng kch th??c c?a m?t b? bi. M?t qu? tr?ng t??ng ???ng 1 ao-x? (28 g). 2-3 kh?u ph?n s?a t bo m?i ngy. M?t kh?u ph?n l 1 c?c (237 mL). 1 kh?u ph?n qu? h?ch, cc lo?i h?t ho?c ??u dng 5 l?n m?i  tu?n. 2-3 kh?u ph?n m? t?t cho tim. Ch?t bo lnh m?nh c tn l axit bo omega-3 ???c tm th?y trong cc th?c ph?m nh? qu? c ch, h?t lanh, s?a t?ng c??ng v tr?ng. Nh?ng ch?t bo ny c?ng ???c tm th?y trong c n??c l?nh, ch?ng h?n nh? c mi, c h?i v c thu. Gi?i h?n l??ng: Th?c ph?m ?ng h?p ho?c ?ng gi s?n. Th?c ph?m giu ch?t bo chuy?n ha, ch?ng h?n nh? m?t s? th?c ph?m chin/rn. Th?c ph?m giu ch?t bo bo ha, ch?ng h?n nh? th?t m?. Cc mn trng mi?ng v ?? ng?t khc, ?? u?ng c ???ng v nh?ng th?c ph?m khc ch?a ???ng ph? gia. S?n ph?m t? s?a nguyn ch?t bo. Khng thm mu?i vo th?c ph?m tr??c khi ?n. Khng ?n qu 4 lng ?? tr?ng m?i tu?n. C? g?ng ?n t?i thi?u 2 b?a chay m?i tu?n. ?n nhi?u th?c ?n n?u t?i nh h?n v ?n i?t th??c ?n ?? nh hng, th??c ?n t? ch?n v th?c ?n nhanh h?n. L?i s?ng Khi ?n ? nh  hng, hy yu c?u th??c ?n c?a qu v? ???c n?u nha?t ho?c khng c mu?i, n?u c th?. N?u qu v? u?ng r??u: Gi?i h?n l??ng r??u qu v? u?ng ? m?c: 0-1 ly/ngy ??i v?i ph? n? khng mang thai. 0-2 ly/ngy ??i v?i nam gi?i. Bi?t r m?t ly c bao nhiu r??u. ? M?, m?t ly t??ng ???ng v?i m?t chai bia 12 ao x? (355 mL), m?t ly r??u vang 5 ao x? (148 mL), ho?c m?t ly r??u m?nh 1 ao x? (44 mL). Thng tin chung Trnh ?n trn 2.300 mg mu?i m?i ngy. N?u b? t?ng huy?t p, qu v? c th? c?n gi?m l??ng dng natri xu?ng ??n m?c 1.500 mg m?i ngy. H?p tc v?i chuyn gia ch?m [place] s?c kh?e c?a qu v? ?? duy tr tr?ng l??ng c? th? c l?i cho s?c kh?e ho?c ?? gi?m cn. Hy h?i xem tr?ng l??ng no l l t??ng cho qu v?. Dnh t nh?t 30 pht t?p th? d?c c th? khi?n tim qu v? ??p nhanh h?n (t?p th? d?c nh?p ?i?u) h?u h?t cc ngy trong tu?n. Cc ho?t ??ng c th? bao g?m ?i b?, b?i, ho?c ??p xe. H?p tc v?i chuyn gia ch?m [place] s?c kh?e ho?c bc s? chuyn khoa dinh d??ng c?a qu v? ?? ?i?u ch?nh ch??ng trnh ?n u?ng ph h?p v?i nhu c?u calo c?a c nhn qu v?. Ti nn ?n nh?ng th?c ph?m  no? Tri cy T?t c? tri cy t??i, kh, ho?c ?ng l?nh. Tri cy ?ng h?p d??i d?ng n??c p t? nhin (khng c b? sung ???ng). Derrek Gu c? Derrek Gu c? t??i ho?c ?ng l?nh (s?ng, h?p, bo? lo? ho?c n??ng). N???c p c chua v n??c rau p i?t natri ho??c gi?m natri. N??c x?t c chua va? h?n h?p c chua nho i?t natri ho??c gi?m natri. Rau ?o?ng h?p i?t natri ho??c gia?m natri. Ng? c?c Bnh m nguyn h?t ho?c nguyn cm. M ?ng nguyn h?t ho?c nguyn cm. Ga?o l??t. B?t y?n m?ch. H?t dim m?ch (quinoa). T?m la m (bulgur). Ng? c?c nguyn h?t v ng? c?c t natri. Bnh m pita. Bnh quy gin t bo, t natri. Bnh ng nguyn cm. Th?t v cc protein khc Ga? ho??c ga? ty bo? da. G ho?c g ty xay. Th?t heo l?c m?. C v h?i s?n. Lng tr?ng tr?ng. Cc lo?i ??u, ??u H Lan ho??c ??u l?ng kh. Qu? h?ch, b? h?t v cc lo?i h?t khng mu?i. ??u ?ng h?p khng ??p mu?i. Mi?ng th?t b n?c ? l?c m?. Th?t n?c n?u s?n t natri ho?c th?t ??p mu?i, ch?ng h?n nh? xc xch ho?c bnh m th?t. S?a S?a t bo (1%) ho?c khng bo (tch bo). Ph mt khng bo, t bo, ho?c gi?m bo. Ph mt s?a g?n kem ho?c ph mt ricotta t natri, khng bo. S?a chua t bo ho?c khng bo. Ph mt t bo, t natri. M? v d?u B? th?c v?t m?m khng c ch?t bo chuy?n ha. D?u th?c v?t. N??c x?t mayonnaise v mn tr?n sa lt t bo, gi?m bo ho?c lo?i nh? (gi?m natri). D?u c?i, d?u rum, d?u  liu, d?u b?, d?u ??u nnh v d?u h??ng d??ng. Qu? b?. Gia v? Th?o d??c. Gia v?. H?n h?p gia v? khng c mu?i. Cc th?c ph?m khc B?ng ng va? ba?nh quy khng mu?i. ?? ng?t khng bo. Nh?ng th?c ph?m li?t k ? trn c th? khng ph?i l m?t danh m?c ??y ?? cc lo?i th?c ph?m  v ?? u?ng m qu v? c th? ?n. Lin h? v?i chuyn gia dinh d??ng ?? c thm thng tin. Ti nn trnh nh?ng th?c ph?m no? Tri cy Tri cy ?ng h?p ngm xi-r loa?ng ho??c ???c. Tri cy kh. Tri cy ngm trong kem ho?c n??c x?t b?. Derrek Gu c? Derrek Gu c? tr?n kem ho??c xa?o. Cc lo?i  rau tr?n n??c x?t pho mt. Rau ?o?ng h?p thng th??ng (khng ph?i lo?i t natri ho??c gia?m natri). N??c x?t c chua ?ng h?p va? h?n h?p c chua nho thng th??ng (khng ph?i lo?i i?t natri ho??c gi?m natri). N???c p rau v c chua thng th??ng (khng ph?i lo?i i?t natri ho??c gi?m natri). D?a chua.  liu. Ng? c?c Cc lo?i bnh n??ng c ch?t bo, ch?ng h?n bnh s?ng b, bnh n??ng x?p, ho?c m?t s? lo?i bnh m. M ?ng kh ho?c cc ti b?t g?o. Th?t v cc protein khc Cc la?t thi?t m??. X??ng s??n. Th?t kh. Th?t l?n mu?i xng khi. Xc xch bologna, [place], v cc lo?i th?t n?u s?n ho?c ??p mu?i khc, ch?ng h?n nh? xc xch ho?c bnh m th?t. Ch?t bo ? ph?n l?ng c?a heo (th?t m?). Mn xc xch l?n ?? rn. Qu? h?ch v cc lo?i h?t ??p mu?i. ??u ?ng h?p c b? sung mu?i. C ?ng h?p ho?c hun khi. Nguyn qu? tr?ng ho?c lng ?? tr?ng. G ho?c g ty cn da. S?a S??a nguyn kem ho??c 2%, kem v n?a n? n?a kia. Ph mt nguyn kem ho?c ph mt kem nguyn ch?t bo. S??a chua nguyn ch?t bo ho?c s?a chua co? ????ng. Pho mt nguyn ch?t bo. B?t kem khng s?a. L??p phu? kem ?a? ?a?nh bng. Pho mt ch? bi?n s?n v pho mt ph?t. M? v d?u B?. B? th?c v?t d?ng th?i. M? l?n. M? tr?u (shortening). B? s??a tru. M?? thi?t xng kho?i. D?u nhi?t ??i, ch?ng h?n nh? d?u d?a, d?u h?t c?, ho?c d?u c?. Gia v? Mu?i hnh, mu?i t?i, mu?i nm, mu?i ?n v mu?i bi?n. N???c x?t [place]. N???c x?t tartar. N??c x?t th?t quay. N???c x?t [place]iy[place]. N??c t??ng, bao g?m loa?i gi?m natri. N??c x?t th?t n??ng. N???c thi?t ?ng h?p v ?ng gi. N??c m?m. D?u ho. N??c x?t cocktail. C?i ng?a mua t?i c?a hng. N??c x?t c chua. M t?t. H??ng li?u th?t v ch?t la?m m?m thi?t. Tho?i b?t canh. T??ng ?t. N??c x?t marinat ch? bi?n s?n ho?c ?ng gi. Gia v? taco ch? bi?n s?n ho?c ?ng gi. ?? gia v?. N???c tr?n sa la?t thng th???ng. Cc th?c ph?m khc B?ng ng v bnh quy m??n. Nh?ng th?c ph?m li?t k ? trn c th? khng  ph?i l m?t danh m?c ??y ?? cc lo?i th?c ph?m v ?? u?ng qu v? nn trnh. Lin h? v?i chuyn gia dinh d??ng ?? c thm thng tin. N?i tm ki?m thm thng tin National Heart, Lung, and Blood Institute (Vi?n Tim, Ph?i v Mu Qu?c gia): PopSteam.is American Heart Association (Hi?p h?i Tim m?ch [place]?): www.heart.org Academy of Nutrition and Dietetics (H?c vi?n Dinh d??ng v Ch? ?? ?n): www.eatright.org National Kidney Foundation [place]? Th?n Qu?c Gia): www.kidney.org Tm t?t Ch??ng trnh ?n u?ng DASH l ch??ng trnh ?n u?ng lnh m?nh ? ???c ch?ng minh c tc d?ng lm gi?m huy?t p cao (t?ng huy?t p). N c?ng c th? lm gi?m nguy c? b? ti?u d??ng tup 2, b?nh tim v ??t qu?. Khi ?ang th?c hi?n theo ch??ng trnh ?n u?ng DASH, m?c ?  ch l ?n nhi?u tri cy v rau c? t??i, ng? c?c nguyn h?t, protein th?t n?c, s?a t bo v ch?t bo t?t cho tim h?n. V?i ch??ng trnh ?n u?ng DASH, qu v? c?n ph?i gi?i h?n l??ng mu?i (natri) ? m?c 2.300 mg m?i ngy. N?u b? t?ng huy?t p, qu v? c th? c?n gi?m l??ng dng natri xu?ng ??n m?c 1.500 mg m?i ngy. H?p tc v?i chuyn gia ch?m [place] s?c kh?e ho?c bc s? chuyn khoa dinh d??ng c?a qu v? ?? ?i?u ch?nh ch??ng trnh ?n u?ng ph h?p v?i nhu c?u calo c?a c nhn qu v?. Thng tin ny khng nh?m m?c ?ch thay th? cho l?i khuyn m chuyn gia ch?m [place] s?c kh?e ni v?i qu v?. Hy b?o ??m qu v? ph?i th?o lu?n b?t k? v?n ?? g m qu v? c v?i chuyn gia ch?m [place] s?c kh?e c?a qu v?. Document Revised: 07/04/2019 Document Reviewed: 07/04/2019 Elsevier Patient Education  [place].

## 2022-04-26 NOTE — Progress Notes (Signed)
Subjective:  Patient ID: Amber Grant, female    DOB: February 22, 1961  Age: 61 y.o. MRN: [id number]  CC: Diabetes and Medication Refill   HPI Amber Grant is a 61 y.o. year old female with a history of hypertension, whitecoat hypertension, hyperlipidemia-type 2 diabetes mellitus (A1c 6.3) here for chronic disease management.   Interval History: A1C 6.3 and her diabetes is diet controlled.  Amber Grant denies visual concerns, hypoglycemia, neuropathy.  Blood pressure remains elevated today in comparison with her home blood pressure monitor performed by myself reveals blood pressure of 165/88.  I have reviewed the memory from her blood pressure monitor on at home her blood pressures range 060-156 systolic. Amber Grant is adherent with lisinopril. Denies additional concerns today. Past Medical History:  Diagnosis Date   Hypertension     No past surgical history on file.  Family History  Problem Relation Age of Onset   Diabetes Brother    Breast cancer Neg Hx     Social History   Socioeconomic History   Marital status: Single    Spouse name: Not on file   Number of children: Not on file   Years of education: Not on file   Highest education level: Not on file  Occupational History   Not on file  Tobacco Use   Smoking status: Never   Smokeless tobacco: Never  Vaping Use   Vaping Use: Never used  Substance and Sexual Activity   Alcohol use: No   Drug use: No   Sexual activity: Not Currently  Other Topics Concern   Not on file  Social History Narrative   Not on file   Social Determinants of Health   Financial Resource Strain: Not on file  Food Insecurity: Not on file  Transportation Needs: Not on file  Physical Activity: Unknown (05/03/2017)   Exercise Vital Sign    Days of Exercise per Week: Patient refused    Minutes of Exercise per Session: Patient refused  Stress: Not on file  Social Connections: Unknown (05/03/2017)   Social Connection and Isolation Panel [NHANES]     Frequency of Communication with Friends and Family: Patient refused    Frequency of Social Gatherings with Friends and Family: Patient refused    Attends Religious Services: Patient refused    Marine scientist or Organizations: Patient refused    Attends Archivist Meetings: Patient refused    Marital Status: Patient refused    No Known Allergies  Outpatient Medications Prior to Visit  Medication Sig Dispense Refill   Blood Glucose Monitoring Suppl (TRUE METRIX METER) w/Device KIT Use as directed to check blood sugar in the morning. 1 kit 0   cholecalciferol (VITAMIN D) 1000 UNITS tablet Take 1,000 Units by mouth daily.     glucose blood (TRUE METRIX BLOOD GLUCOSE TEST) test strip Use as instructed to check blood sugar in the morning. 100 each 11   Multiple Vitamins-Minerals (MULTIVITAMIN PO) Take by mouth.     TRUEplus Lancets 28G MISC Use to check blood sugar once in the morning. 100 each 11   atorvastatin (LIPITOR) 40 MG tablet TAKE 1 TABLET (40 MG TOTAL) BY MOUTH DAILY. 90 tablet 1   lisinopril (ZESTRIL) 40 MG tablet TAKE 1 TABLET (40 MG TOTAL) BY MOUTH DAILY. 90 tablet 1   No facility-administered medications prior to visit.     ROS Review of Systems  Constitutional:  Negative for activity change and appetite change.  HENT:  Negative for sinus pressure and  sore throat.   Respiratory:  Negative for chest tightness, shortness of breath and wheezing.   Cardiovascular:  Negative for chest pain and palpitations.  Gastrointestinal:  Negative for abdominal distention, abdominal pain and constipation.  Genitourinary: Negative.   Musculoskeletal: Negative.   Psychiatric/Behavioral:  Negative for behavioral problems and dysphoric mood.     Objective:  BP (!) 160/88   Pulse (!) 113   Ht 5' (1.524 m)   Wt 115 lb 9.6 oz (52.4 kg)   SpO2 100%   BMI 22.58 kg/m      04/26/2022   10:15 AM 04/26/2022    9:42 AM 11/16/2021   10:45 AM  BP/Weight  Systolic BP 016 010  932  Diastolic BP 88 92 85  Wt. (Lbs)  115.6 113  BMI  22.58 kg/m2 22.07 kg/m2      Physical Exam Constitutional:      Appearance: Amber Grant is well-developed.  Cardiovascular:     Rate and Rhythm: Normal rate.     Heart sounds: Normal heart sounds. No murmur heard. Pulmonary:     Effort: Pulmonary effort is normal.     Breath sounds: Normal breath sounds. No wheezing or rales.  Chest:     Chest wall: No tenderness.  Abdominal:     General: Bowel sounds are normal. There is no distension.     Palpations: Abdomen is soft. There is no mass.     Tenderness: There is no abdominal tenderness.  Musculoskeletal:        General: Normal range of motion.     Right lower leg: No edema.     Left lower leg: No edema.  Neurological:     Mental Status: Amber Grant is alert and oriented to person, place, and time.  Psychiatric:        Mood and Affect: Mood normal.        Latest Ref Rng & Units 08/11/2021   10:10 AM 02/18/2021   11:58 AM 07/08/2020   10:49 AM  CMP  Glucose 70 - 99 mg/dL 128  95  114   BUN 6 - 24 mg/dL _0 Creatinine 0.57 - 1.00 mg/dL 0.60  0.66  0.65   Sodium 134 - 144 mmol/L 134  134  135   Potassium 3.5 - 5.2 mmol/L 4.9  4.3  4.3   Chloride 96 - 106 mmol/L 97  95  96   CO2 20 - 29 mmol/L _1 Calcium 8.7 - 10.2 mg/dL 9.7  9.5  9.5   Total Protein 6.0 - 8.5 g/dL 7.9   7.7   Total Bilirubin 0.0 - 1.2 mg/dL 0.7   0.9   Alkaline Phos 44 - 121 IU/L 67   56   AST 0 - 40 IU/L 28   27   ALT 0 - 32 IU/L 28   28     Lipid Panel     Component Value Date/Time   CHOL 131 08/11/2021 1010   TRIG 49 08/11/2021 1010   HDL 59 08/11/2021 1010   CHOLHDL 2.2 07/08/2020 1049   CHOLHDL 2.4 07/13/2016 0936   VLDL 18 07/13/2016 0936   LDLCALC 61 08/11/2021 1010    CBC    Component Value Date/Time   WBC 4.8 07/13/2016 0936   RBC 4.49 07/13/2016 0936   HGB 13.7 07/13/2016 0936   HCT 41.5 07/13/2016 0936   PLT 292 07/13/2016 0936   MCV 92.4 07/13/2016 0936  MCH 30.5  07/13/2016 0936   MCHC 33.0 07/13/2016 0936   RDW 13.2 07/13/2016 0936   LYMPHSABS 1,920 07/13/2016 0936   MONOABS 192 (L) 07/13/2016 0936   EOSABS 192 07/13/2016 0936   BASOSABS 48 07/13/2016 0936    Lab Results  Component Value Date   HGBA1C 6.3 04/26/2022    Assessment & Plan:  1. Type 2 diabetes mellitus with other specified complication, without long-term current use of insulin (HCC) Diet controlled with A1c of 6.3 Counseled on Diabetic diet, my plate method, 559 minutes of moderate intensity exercise/week Blood sugar logs with fasting goals of 80-120 mg/dl, random of less than 180 and in the event of sugars less than 60 mg/dl or greater than 400 mg/dl encouraged to notify the clinic. Advised on the need for annual eye exams, annual foot exams, Pneumonia vaccine. - POCT glucose (manual entry) - POCT glycosylated hemoglobin (Hb A1C) - CMP14+EGFR - LP+Non-HDL Cholesterol  2. Hyperlipidemia associated with type 2 diabetes mellitus (HCC) Controlled Low-cholesterol diet - atorvastatin (LIPITOR) 40 MG tablet; TAKE 1 TABLET (40 MG TOTAL) BY MOUTH DAILY.  Dispense: 90 tablet; Refill: 1  3. Hypertension associated with diabetes ([place]) Uncontrolled Whitecoat hypertension contributing as blood pressure log from home has been controlled Counseled on blood pressure goal of less than 130/80, low-sodium, DASH diet, medication compliance, 150 minutes of moderate intensity exercise per week. Discussed medication compliance, adverse effects. - lisinopril (ZESTRIL) 40 MG tablet; TAKE 1 TABLET (40 MG TOTAL) BY MOUTH DAILY.  Dispense: 90 tablet; Refill: 1  4. White coat syndrome with diagnosis of hypertension See #3 above - lisinopril (ZESTRIL) 40 MG tablet; TAKE 1 TABLET (40 MG TOTAL) BY MOUTH DAILY.  Dispense: 90 tablet; Refill: 1  5. Screening for colon cancer - Fecal occult blood, imunochemical([facility])   Health Care Maintenance: Up-to-date on flu shot.  Printed prescription  for shingles vaccine so Amber Grant can receive this from the pharmacy downstairs for medication assistance. Meds ordered this encounter  Medications   atorvastatin (LIPITOR) 40 MG tablet    Sig: TAKE 1 TABLET (40 MG TOTAL) BY MOUTH DAILY.    Dispense:  90 tablet    Refill:  1    Please dispense #90   lisinopril (ZESTRIL) 40 MG tablet    Sig: TAKE 1 TABLET (40 MG TOTAL) BY MOUTH DAILY.    Dispense:  90 tablet    Refill:  1    Dispense #90    Follow-up: Return in about 6 months (around 10/25/2022) for Chronic medical conditions.       Charlott Rakes, MD, FAAFP. [facility] and [place], [employer]   04/26/2022, 10:18 AM

## 2022-04-27 LAB — CMP14+EGFR
ALT: 31 IU/L (ref 0–32)
AST: 28 IU/L (ref 0–40)
Albumin/Globulin Ratio: 1.5 (ref 1.2–2.2)
Albumin: 4.9 g/dL (ref 3.8–4.9)
Alkaline Phosphatase: 65 IU/L (ref 44–121)
BUN/Creatinine Ratio: 23 (ref 9–23)
BUN: 15 mg/dL (ref 6–24)
Bilirubin Total: 0.5 mg/dL (ref 0.0–1.2)
CO2: 19 mmol/L — ABNORMAL LOW (ref 20–29)
Calcium: 9.4 mg/dL (ref 8.7–10.2)
Chloride: 97 mmol/L (ref 96–106)
Creatinine, Ser: 0.66 mg/dL (ref 0.57–1.00)
Globulin, Total: 3.2 g/dL (ref 1.5–4.5)
Glucose: 121 mg/dL — ABNORMAL HIGH (ref 70–99)
Potassium: 4.4 mmol/L (ref 3.5–5.2)
Sodium: 133 mmol/L — ABNORMAL LOW (ref 134–144)
Total Protein: 8.1 g/dL (ref 6.0–8.5)
eGFR: 101 mL/min/{1.73_m2} (ref >59)

## 2022-04-27 LAB — LP+NON-HDL CHOLESTEROL
Cholesterol, Total: 157 mg/dL (ref 100–199)
HDL: 65 mg/dL (ref >39)
LDL Chol Calc (NIH): 80 mg/dL (ref 0–99)
Total Non-HDL-Chol (LDL+VLDL): 92 mg/dL (ref 0–129)
Triglycerides: 62 mg/dL (ref 0–149)
VLDL Cholesterol Cal: 12 mg/dL (ref 5–40)

## 2022-04-30 ENCOUNTER — Other Ambulatory Visit: Payer: Self-pay

## 2022-05-14 ENCOUNTER — Other Ambulatory Visit: Payer: Self-pay

## 2022-05-17 ENCOUNTER — Other Ambulatory Visit: Payer: Self-pay

## 2022-05-17 ENCOUNTER — Telehealth: Payer: Self-pay | Admitting: Emergency Medicine

## 2022-05-17 NOTE — Telephone Encounter (Signed)
Copied from CRM [id number]. Topic: General - Inquiry >> May 17, 2022 10:22 AM Marlow Baars wrote: Reason for CRM: The daughter of the patient called in about a bill received for date of service 6/05. The patient has [employer] and the daughter states the insurance usually covers 100% of the bill as long as it is within the eligibility period. The date of service was within the eligibility period as the dates are between 05/22/21 -11/20/21. The daughter was instructed by the billing department to call her providers office to discuss and get changed. Please assist patient further.

## 2022-05-20 ENCOUNTER — Other Ambulatory Visit: Payer: Self-pay

## 2022-05-28 ENCOUNTER — Other Ambulatory Visit: Payer: Self-pay

## 2022-05-31 ENCOUNTER — Other Ambulatory Visit: Payer: Self-pay

## 2022-05-31 MED ORDER — ZOSTER VAC RECOMB ADJUVANTED 50 MCG/0.5ML IM SUSR
0.5000 mL | INTRAMUSCULAR | 0 refills | Status: DC
  Filled 2022-05-31: qty 0.5, 1d supply, fill #0

## 2022-07-07 ENCOUNTER — Other Ambulatory Visit: Payer: Self-pay

## 2022-08-12 ENCOUNTER — Other Ambulatory Visit: Payer: Self-pay

## 2022-08-16 ENCOUNTER — Other Ambulatory Visit: Payer: Self-pay

## 2022-08-16 MED ORDER — ZOSTER VAC RECOMB ADJUVANTED 50 MCG/0.5ML IM SUSR
0.5000 mL | Freq: Once | INTRAMUSCULAR | 0 refills | Status: AC
  Filled 2022-08-16 – 2022-10-08 (×2): qty 0.5, 1d supply, fill #0

## 2022-08-20 ENCOUNTER — Other Ambulatory Visit: Payer: Self-pay

## 2022-10-08 ENCOUNTER — Other Ambulatory Visit: Payer: Self-pay

## 2022-10-25 ENCOUNTER — Other Ambulatory Visit: Payer: Self-pay

## 2022-10-26 ENCOUNTER — Ambulatory Visit: Payer: Medicaid Other | Attending: Family Medicine | Admitting: Family Medicine

## 2022-10-26 ENCOUNTER — Other Ambulatory Visit: Payer: Self-pay

## 2022-10-26 ENCOUNTER — Encounter: Payer: Self-pay | Admitting: Family Medicine

## 2022-10-26 VITALS — BP 171/75 | HR 103 | Ht 60.0 in | Wt 115.4 lb

## 2022-10-26 DIAGNOSIS — I152 Hypertension secondary to endocrine disorders: Secondary | ICD-10-CM | POA: Diagnosis not present

## 2022-10-26 DIAGNOSIS — E1159 Type 2 diabetes mellitus with other circulatory complications: Secondary | ICD-10-CM | POA: Diagnosis not present

## 2022-10-26 DIAGNOSIS — Z0120 Encounter for dental examination and cleaning without abnormal findings: Secondary | ICD-10-CM

## 2022-10-26 DIAGNOSIS — E1169 Type 2 diabetes mellitus with other specified complication: Secondary | ICD-10-CM | POA: Diagnosis not present

## 2022-10-26 DIAGNOSIS — Z9189 Other specified personal risk factors, not elsewhere classified: Secondary | ICD-10-CM | POA: Diagnosis not present

## 2022-10-26 DIAGNOSIS — I10 Essential (primary) hypertension: Secondary | ICD-10-CM | POA: Diagnosis not present

## 2022-10-26 DIAGNOSIS — Z794 Long term (current) use of insulin: Secondary | ICD-10-CM | POA: Diagnosis not present

## 2022-10-26 DIAGNOSIS — E785 Hyperlipidemia, unspecified: Secondary | ICD-10-CM | POA: Diagnosis not present

## 2022-10-26 LAB — POCT GLYCOSYLATED HEMOGLOBIN (HGB A1C): HbA1c, POC (controlled diabetic range): 6.6 % (ref 0.0–7.0)

## 2022-10-26 MED ORDER — LISINOPRIL 40 MG PO TABS
40.0000 mg | ORAL_TABLET | Freq: Every day | ORAL | 1 refills | Status: DC
Start: 2022-10-26 — End: 2023-02-02
  Filled 2022-10-26: qty 90, fill #0
  Filled 2022-11-15: qty 90, 90d supply, fill #0
  Filled 2023-02-15: qty 90, 90d supply, fill #1

## 2022-10-26 MED ORDER — METFORMIN HCL 500 MG PO TABS
500.0000 mg | ORAL_TABLET | Freq: Every day | ORAL | 1 refills | Status: DC
Start: 2022-10-26 — End: 2023-02-07
  Filled 2022-10-26: qty 90, 90d supply, fill #0

## 2022-10-26 MED ORDER — ATORVASTATIN CALCIUM 40 MG PO TABS
40.0000 mg | ORAL_TABLET | Freq: Every day | ORAL | 1 refills | Status: DC
Start: 2022-10-26 — End: 2023-02-02
  Filled 2022-10-26 – 2022-11-15 (×2): qty 90, 90d supply, fill #0
  Filled 2023-02-15: qty 90, 90d supply, fill #1

## 2022-10-26 NOTE — Patient Instructions (Signed)
Qu?n l t?ng huy?t p Managing Your Hypertension T?ng huy?t p, cn ???c g?i l huy?t p cao, l khi l?c b?m mu p vo thnh ??ng m?ch qu m?nh. Cc ??ng m?ch l cc m?ch mu mang mu t? tim ?i kh?p c? th? qu v?. T?ng huy?t p bu?c tim ph?i lm vi?c nhi?u h?n ?? b?m mu v c th? khi?n cho cc ??ng m?ch b? h?p ho?c c?ng. Tm hi?u v? cc ch? s? huy?t p Ch? s? huy?t p g?m m?t ch? s? cao trn m?t ch? s? th?p: Ch? s? ??u tin, hay ch? s? cao nh?t, ???c g?i l huy?t p tm thu. ?y l s? ?o p su?t trong ??ng m?ch khi tim qu v? ??p. Ch? s? th? hai, hay ch? s? th?p nh?t, ???c g?i l huy?t p tm tr??ng. ?y l s? ?o p su?t trong ??ng m?ch khi tim qu v? gin ra. ??i v?i h?u h?t m?i ng??i, huy?t p bnh th??ng l d??i 120/80. Huy?t p m?c tiu c nhn c?a qu v? c th? khc nhau ty thu?c v tnh tr?ng b?nh l, tu?i v cc y?u t? khc. Huy?t p ???c phn lo?i thnh b?n giai ?o?n. D?a trn ch? s? huy?t p c?a qu v?, chuyn gia ch?m sc s?c kh?e c?a qu v? c th? s? d?ng nh?ng giai ?o?n sau ?y ?? xc ??nh lo?i ?i?u tr? qu v? c?n, n?u c. Huy?t p tm thu v huy?t p tm tr??ng ???c ?o theo ??n v? mm th?y ngn (mmHg). Bnh th??ng Huy?t p tm thu: d??i 120. Huy?t p tm tr??ng: d??i 80. Cao Huy?t p tm thu: 120-129. Huy?t p tm tr??ng: d??i 80. T?ng huy?t p giai ?o?n 1 Huy?t p tm thu: 130-139. Huy?t p tm tr??ng: 80-89. T?ng huy?t p giai ?o?n 2 Huy?t p tm thu: t? 140 tr? ln. Huy?t p tm tr??ng: t? 90 tr? ln. Tnh tr?ng ny c th? ?nh h??ng ??n ti nh? th? no? X? tr t?ng huy?t p c?a qu v? l r?t quan tr?ng. Theo th?i gian, t?ng huy?t p c th? gy t?n th??ng cc ??ng m?ch v lm gi?m l?u l??ng mu ??n cc b? ph?n c?a c? th?, g?m c? no, tim v th?n. T?ng huy?t p khng ???c ki?m sot ho?c ?i?u tr? c th? d?n t?i: Nh?i mu c? tim. ??t qu?. M?ch mu b? y?u (phnh m?ch). Suy tim. T?n th??ng th?n. T?n th??ng m?t. Cc v?n ?? v? t?p trung v tr nh?. Sa st tr tu? do m?ch  mu. Ti c th? th??c hi?n nh?ng hnh ??ng no ?? ki?m sot tnh tr?ng ny? C th? qu?n l t?ng huy?t p b?ng cch thay ??i l?i s?ng v c th? l b?ng cch dng thu?c. Chuyn gia ch?m sc s?c kh?e c?a qu v? s? gip qu v? ln k? ho?ch ??a huy?t p v? gi?i h?n bnh th??ng. Qu v? c th? ???c gi?i thi?u ?? ???c t? v?n v? ch? ?? ?n lnh m?nh v ho?t ??ng th? ch?t. Dinh d??ng  ?n ch? ?? giu ch?t x? v kali v t mu?i (natri), t ???ng b? sung v t ch?t bo. M?t k? ho?ch ?n ki?u m?u ???c g?i l ch? ?? ?n DASH. DASH l vi?t t?t c?a Dietary Approaches to Stop Hypertension (Ph??ng php ti?p c?n ch? ?? ?n u?ng ?? lm gi?m huy?t p). ?n theo cch ny: ?n nhi?u tri cy v rau c? t??i. Vo m?i b?a ?n, c? g?ng dnh m?t n?a ??a cho tri cy v rau c?. ?  n ng? c?c nguyn cm, ch?ng h?n nh? m ?ng lm t? b?t m nguyn cm, g?o l?t, ho?c bnh m t? b?t m nguyn cm. Cho ngu? c?c nguyn ca?m va?o kho?ng m?t ph?n t? ??a. ?n cc s?n ph?m s?a t bo. Trnh nh?ng mi?ng th?t nhi?u m?, th?t ch? bi?n s?n ho?c th?t ??p mu?i v th?t gia c?m c da. Dnh kho?ng m?t ph?n t? ??a c?a qu v? cho cc protein n?c, ch?ng h?n nh? c, th?t g khng da, ??u, tr?ng v ??u ph?. Trnh nh?ng th?c ph?m ch? bi?n s?n ho?c lm s?n. Nh?ng th?c ph?m ny th??ng c nhi?u natri, b? sung ???ng v ch?t bo h?n. Gi?m l??ng dng natri hng ngy c?a qu v?. Nhi?u ng??i b? t?ng huy?t p c?n ?n d??i 1.500 mg natri m?i ngy. L?i s?ng  H?p tc v?i chuyn gia ch?m sc s?c kh?e c?a qu v? ?? duy tr tr?ng l??ng c? th? c l?i cho s?c kh?e ho?c ?? gi?m cn. Hy h?i xem tr?ng l??ng no l l t??ng cho qu v?. Dnh t nh?t 30 pht t?p th? d?c c th? khi?n tim qu v? ??p nhanh h?n (t?p th? d?c nh?p ?i?u) h?u h?t cc ngy trong tu?n. Cc ho?t ??ng c th? bao g?m ?i b?, b?i, ho?c ??p xe. ??a vo bi t?p t?ng s?c m?nh c? b?p (bi t?p khng l?c), ch?ng h?n nh? nng t?, trong khun kh? thi quen luy?n t?p hng tu?n c?a qu v?. C? g?ng t?p nh?ng lo?i bi t?p ny  trong vng 30 pht, t nh?t l 3 ngy m?i tu?n. Khng s? d?ng b?t k? s?n ph?m no c nicotine ho?c thu?c l. Nh?ng s?n ph?m ny bao g?m thu?c l d?ng ht, thu?c l d?ng nhai v d?ng c? ht thu?c, ch?ng h?n nh? thu?c l ?i?n t?. N?u qu v? c?n gip ?? ?? cai thu?c, hy h?i chuyn gia ch?m sc s?c kh?e. Ki?m sot b?t k? tnh tr?ng ko di (m?n tnh) no m qu v? c, ch?ng h?n nh? cholesterol cao ho?c ti?u ???ng. Xc ??nh cc ngu?n gy c?ng th?ng v tm cch qu?n l c?ng th?ng. Vi?c ny c th? bao g?m thi?n ??nh, th? su ho?c dnh th?i gian cho cc ho?t ??ng vui v?. S? d?ng r??u Khng u?ng r??u n?u: Chuyn gia ch?m sc s?c kh?e khuyn qu v? khng u?ng r??u. Qu v? c thai, c th? c thai, ho?c ?ang c k? ho?ch c thai. N?u qu v? u?ng r??u: Gi?i h?n l??ng r??u qu v? u?ng ? m?c: 0-1 ly/ngy ??i v?i n? gi?i. 0-2 ly/ngy ??i v?i nam gi?i. Bi?t m?t ly c bao nhiu r??u. ? M?, m?t ly t??ng ???ng v?i m?t chai bia 12 ao-x? (355 mL), m?t ly r??u vang 5 ao-x? (148 mL), ho?c m?t ly r??u m?nh 1 ao-x? (44 mL). Thu?c Chuyn gia ch?m sc s?c kh?e c th? k ??n thu?c n?u thay ??i l?i s?ng khng ?? ?? ??a huy?t p v? m?c c th? ki?m sot ???c v n?u: Huy?t p tm thu c?a qu v? t? 130 tr? ln. Huy?t p tm tr??ng c?a qu v? t? 80 tr? ln. Ch? s? d?ng thu?c theo h??ng d?n c?a chuyn gia ch?m sc s?c kh?e cu?a quy? vi?. Lm theo ch? d?n m?t cch c?n th?n. Thu?c ?i?u tr? huy?t p ph?i ???c dng theo ch? d?n c?a chuyn gia ch?m sc s?c kh?e. Thu?c c?ng s? khng c tc d?ng khi qu v? b? dng thu?c. Vi?c b? dng thu?c c?ng lm qu   v? c nguy c? pht sinh v?n ??. Theo di Tr??c khi qu v? theo di huy?t p: Khng ht thu?c, u?ng ?? u?ng ch?a caffein, ho?c t?p th? d?c trong vng 30 pht tr??c khi ?o. ?i v? sinh v ?i ti?u (ti?u ti?n). Ng?i yn t?nh trong t nh?t 5 pht tr??c khi ?o. Theo di huy?t p c?a qu v? t?i nh theo h??ng d?n c?a chuyn gia ch?m sc s?c kh?e. ?? lm ?i?u ny: Ng?i th?ng v c t?a l?ng. ??  hai bn chn b?ng ph?ng trn sn nh. Khng b?t cho chn. Ch?ng cnh tay trn m?t b? m?t ph?ng, ch?ng h?n nh? bn. ??m b?o r?ng ph?n cnh tay trn c?a qu v? ngang b?ng tim. M?i l?n qu v? ?o, hy l?y hai ho?c ba ch? s? cch nhau m?t pht v ghi l?i cc k?t qu? ?. Qu v? c?ng c th? c?n c chuyn gia ch?m sc s?c kho? ki?m tra huy?t p c?a qu v? th??ng xuyn. Thng tin chung Trao ??i v?i chuyn gia ch?m sc s?c kh?e c?a qu v? v? ch? ?? ?n, thi qun luy?n t?p v cc y?u t? l?i s?ng khc c th? gp ph?n lm t?ng huy?t p. Cng v?i chuyn gia ch?m sc s?c kh?e xem xt l?i t?t c? cc lo?i thu?c qu v? dng b?i v c th? c tc d?ng ph? ho?c t??ng tc thu?c. Tun th? theo t?t c? cc l?n khm l?i. Chuyn gia ch?m sc s?c kh?e c?a qu v? c th? gip qu v? t?o ra v ?i?u ch?nh k? ho?ch ki?m sot huy?t p cao. N?i tm thm thng tin National Heart, Lung, and Blood Institute (Vi?n Tim, Ph?i v Mu Qu?c gia): www.nhlbi.nih.gov American Heart Association (Hi?p h?i Tim m?ch Hoa K?): www.heart.org Hy lin l?c v?i chuyn gia ch?m sc s?c kh?e n?u: Qu v? ngh? l qu v? c ph?n ?ng v?i cc lo?i thu?c ? dng. Qu v? b? ?au ??u l?p ?i l?p l?i (ti pht). Qu v? c?m th?y chng m?t. Qu v? b? s?ng ? c? chn. Qu v? c v?n ?? v? th? l?c. Yu c?u tr? gip ngay l?p t?c n?u: Qu v? b? ?au ??u r?t nhi?u ho?c l l?n. Qu v? b? y?u b?t th??ng ho?c t b, ho?c c?m th?y b? ng?t. Qu v? b? ?au r?t nhi?u ? ng?c ho?c b?ng. Qu v? nn nhi?u l?n. Qu v? b? kh th?. Nh?ng tri?u ch?ng ny c th? l tr??ng h?p c?p c?u. Yu c?u tr? gip ngay l?p t?c. Hy g?i 911. Khng ch? xem tri?u ch?ng c h?t khng. Khng t? li xe ??n b?nh vi?n. Tm t?t T?ng huy?t p l khi l?c b?m mu qua cc ??ng m?ch c?a qu v? qu m?nh. N?u tnh tr?ng ny khng ???c ki?m sot, n c th? khi?n qu v? c nguy c? b? cc bi?n ch?ng nghim tr?ng. Huy?t p m?c tiu c nhn c?a qu v? c th? khc nhau ty thu?c v tnh tr?ng b?nh l, tu?i v cc y?u  t? khc. ??i v?i h?u h?t m?i ng??i, huy?t p bnh th??ng l d??i 120/80. Ki?m sot t?ng huy?t p b?ng cch thay ??i l?i s?ng, dng thu?c, ho?c c? hai. Thay ??i l?i s?ng ?? gip ki?m sot t?ng huy?t p bao g?m gi?m cn, ?n ch? ?? ?n c l?i cho s?c kh?e, t natri, t?p th? d?c nhi?u h?n, ng?ng ht thu?c v h?n ch? u?ng r??u. Thng tin ny khng nh?m m?c ?ch thay th? cho l?i khuyn m chuyn gia   ch?m sc s?c kh?e ni v?i qu v?. Hy b?o ??m qu v? ph?i th?o lu?n b?t k? v?n ?? g m qu v? c v?i chuyn gia ch?m sc s?c kh?e c?a qu v?. Document Revised: 04/08/2021 Document Reviewed: 04/08/2021 Elsevier Patient Education  2023 Elsevier Inc.  

## 2022-10-26 NOTE — Progress Notes (Signed)
Subjective:  Patient ID: Amber Grant, female    DOB: 03-10-1961  Age: 62 y.o. MRN: [id number]  CC: Diabetes   HPI Amber Grant is a 62 y.o. year old female with a history of hypertension, whitecoat hypertension, hyperlipidemia-type 2 diabetes mellitus (A1c 6.6) here for chronic disease management.   Interval History:  BP at home have been 116-125/65-73 cording to her blood pressure log but blood pressure in the clinic is elevated at 171/75.  She states she is nervous.  I have also use a blood pressure monitor to check her blood pressure with systolic at 173/70.  She is adherent with her statin but for her diabetes she is currently not on any medications but on diet control. She would like a referral to the dentist for routine dental cleaning and also because the cavity in her tooth has fallen off. Past Medical History:  Diagnosis Date   Hypertension     No past surgical history on file.  Family History  Problem Relation Age of Onset   Diabetes Brother    Breast cancer Neg Hx     Social History   Socioeconomic History   Marital status: Single    Spouse name: Not on file   Number of children: Not on file   Years of education: Not on file   Highest education level: Not on file  Occupational History   Not on file  Tobacco Use   Smoking status: Never   Smokeless tobacco: Never  Vaping Use   Vaping Use: Never used  Substance and Sexual Activity   Alcohol use: No   Drug use: No   Sexual activity: Not Currently  Other Topics Concern   Not on file  Social History Narrative   Not on file   Social Determinants of Health   Financial Resource Strain: Not on file  Food Insecurity: Not on file  Transportation Needs: Not on file  Physical Activity: Unknown (05/03/2017)   Exercise Vital Sign    Days of Exercise per Week: Patient declined    Minutes of Exercise per Session: Patient declined  Stress: Not on file  Social Connections: Unknown (05/03/2017)   Social  Connection and Isolation Panel [NHANES]    Frequency of Communication with Friends and Family: Patient declined    Frequency of Social Gatherings with Friends and Family: Patient declined    Attends Religious Services: Patient declined    Database administrator or Organizations: Patient declined    Attends Banker Meetings: Patient declined    Marital Status: Patient declined    No Known Allergies  Outpatient Medications Prior to Visit  Medication Sig Dispense Refill   Blood Glucose Monitoring Suppl (TRUE METRIX METER) w/Device KIT Use as directed to check blood sugar in the morning. 1 kit 0   cholecalciferol (VITAMIN D) 1000 UNITS tablet Take 1,000 Units by mouth daily.     glucose blood (TRUE METRIX BLOOD GLUCOSE TEST) test strip Use as instructed to check blood sugar in the morning. 100 each 11   Multiple Vitamins-Minerals (MULTIVITAMIN PO) Take by mouth.     TRUEplus Lancets 28G MISC Use to check blood sugar once in the morning. 100 each 11   atorvastatin (LIPITOR) 40 MG tablet Take 1 tablet (40 mg total) by mouth daily. 90 tablet 1   lisinopril (ZESTRIL) 40 MG tablet TAKE 1 TABLET (40 MG TOTAL) BY MOUTH DAILY. 90 tablet 1   No facility-administered medications prior to visit.  ROS Review of Systems  Constitutional:  Negative for activity change and appetite change.  HENT:  Negative for sinus pressure and sore throat.   Respiratory:  Negative for chest tightness, shortness of breath and wheezing.   Cardiovascular:  Negative for chest pain and palpitations.  Gastrointestinal:  Negative for abdominal distention, abdominal pain and constipation.  Genitourinary: Negative.   Musculoskeletal: Negative.   Psychiatric/Behavioral:  Negative for behavioral problems and dysphoric mood.     Objective:  BP (!) 171/75   Pulse (!) 103   Ht 5' (1.524 m)   Wt 115 lb 6.4 oz (52.3 kg)   SpO2 100%   BMI 22.54 kg/m      10/26/2022    9:52 AM 10/26/2022    9:21 AM  04/26/2022   10:15 AM  BP/Weight  Systolic BP 171 173 160  Diastolic BP 75 78 88  Wt. (Lbs)  115.4   BMI  22.54 kg/m2       Physical Exam Constitutional:      Appearance: She is well-developed.  Cardiovascular:     Rate and Rhythm: Normal rate.     Heart sounds: Normal heart sounds. No murmur heard. Pulmonary:     Effort: Pulmonary effort is normal.     Breath sounds: Normal breath sounds. No wheezing or rales.  Chest:     Chest wall: No tenderness.  Abdominal:     General: Bowel sounds are normal. There is no distension.     Palpations: Abdomen is soft. There is no mass.     Tenderness: There is no abdominal tenderness.  Musculoskeletal:        General: Normal range of motion.     Right lower leg: No edema.     Left lower leg: No edema.  Neurological:     Mental Status: She is alert and oriented to person, place, and time.  Psychiatric:        Mood and Affect: Mood normal.        Latest Ref Rng & Units 04/26/2022   10:27 AM 08/11/2021   10:10 AM 02/18/2021   11:58 AM  CMP  Glucose 70 - 99 mg/dL 960  454  95   BUN 6 - 24 mg/dL 15  13  8    Creatinine 0.57 - 1.00 mg/dL 0.98  1.19  1.47   Sodium 134 - 144 mmol/L 133  134  134   Potassium 3.5 - 5.2 mmol/L 4.4  4.9  4.3   Chloride 96 - 106 mmol/L 97  97  95   CO2 20 - 29 mmol/L 19  22  16    Calcium 8.7 - 10.2 mg/dL 9.4  9.7  9.5   Total Protein 6.0 - 8.5 g/dL 8.1  7.9    Total Bilirubin 0.0 - 1.2 mg/dL 0.5  0.7    Alkaline Phos 44 - 121 IU/L 65  67    AST 0 - 40 IU/L 28  28    ALT 0 - 32 IU/L 31  28      Lipid Panel     Component Value Date/Time   CHOL 157 04/26/2022 1027   TRIG 62 04/26/2022 1027   HDL 65 04/26/2022 1027   CHOLHDL 2.2 07/08/2020 1049   CHOLHDL 2.4 07/13/2016 0936   VLDL 18 07/13/2016 0936   LDLCALC 80 04/26/2022 1027    CBC    Component Value Date/Time   WBC 4.8 07/13/2016 0936   RBC 4.49 07/13/2016 0936   HGB 13.7  07/13/2016 0936   HCT 41.5 07/13/2016 0936   PLT 292 07/13/2016  0936   MCV 92.4 07/13/2016 0936   MCH 30.5 07/13/2016 0936   MCHC 33.0 07/13/2016 0936   RDW 13.2 07/13/2016 0936   LYMPHSABS 1,920 07/13/2016 0936   MONOABS 192 (L) 07/13/2016 0936   EOSABS 192 07/13/2016 0936   BASOSABS 48 07/13/2016 0936    Lab Results  Component Value Date   HGBA1C 6.6 10/26/2022    Assessment & Plan:  1. Type 2 diabetes mellitus with other specified complication, without long-term current use of insulin (HCC) A1c of 6.6 Will initiate metformin as his A1c has trended up from 6.3 now to 6.6 Counseled on Diabetic diet, my plate method, 865 minutes of moderate intensity exercise/week Blood sugar logs with fasting goals of 80-120 mg/dl, random of less than 784 and in the event of sugars less than 60 mg/dl or greater than 696 mg/dl encouraged to notify the clinic. Advised on the need for annual eye exams, annual foot exams, Pneumonia vaccine. - POCT glycosylated hemoglobin (Hb A1C) - Microalbumin / creatinine urine ratio - LP+Non-HDL Cholesterol - CMP14+EGFR - metFORMIN (GLUCOPHAGE) 500 MG tablet; Take 1 tablet (500 mg total) by mouth daily with breakfast.  Dispense: 90 tablet; Refill: 1  2. Need for dental care - Ambulatory referral to Dentistry  3. Hypertension associated with diabetes (HCC) Uncontrolled Whitecoat hypertension playing a role I will have her follow-up with the clinical pharmacist and if blood pressure is still above goal we may need to add on low-dose amlodipine She will also bring in her blood pressure monitor for comparison - lisinopril (ZESTRIL) 40 MG tablet; Take 1 tablet (40 mg total) by mouth daily.  Dispense: 90 tablet; Refill: 1  4. White coat syndrome with diagnosis of hypertension See #3 above - lisinopril (ZESTRIL) 40 MG tablet; Take 1 tablet (40 mg total) by mouth daily.  Dispense: 90 tablet; Refill: 1  5. Hyperlipidemia associated with type 2 diabetes mellitus (HCC) Controlled Low-cholesterol diet - atorvastatin (LIPITOR) 40  MG tablet; Take 1 tablet (40 mg total) by mouth daily.  Dispense: 90 tablet; Refill: 1    Meds ordered this encounter  Medications   atorvastatin (LIPITOR) 40 MG tablet    Sig: Take 1 tablet (40 mg total) by mouth daily.    Dispense:  90 tablet    Refill:  1    Please dispense #90   lisinopril (ZESTRIL) 40 MG tablet    Sig: Take 1 tablet (40 mg total) by mouth daily.    Dispense:  90 tablet    Refill:  1    Dispense #90   metFORMIN (GLUCOPHAGE) 500 MG tablet    Sig: Take 1 tablet (500 mg total) by mouth daily with breakfast.    Dispense:  90 tablet    Refill:  1    Follow-up: Return in about 3 months (around 01/26/2023) for Blood pressure follow-up with Franky Macho, Chronic medical conditions 3 months.       Hoy Register, MD, FAAFP. [facility] [place], [place] [phone]   10/26/2022, 10:20 AM

## 2022-10-26 NOTE — Progress Notes (Signed)
Dental referral

## 2022-10-27 LAB — LP+NON-HDL CHOLESTEROL
Cholesterol, Total: 150 mg/dL (ref 100–199)
HDL: 68 mg/dL (ref >39)
LDL Chol Calc (NIH): 66 mg/dL (ref 0–99)
Total Non-HDL-Chol (LDL+VLDL): 82 mg/dL (ref 0–129)
Triglycerides: 83 mg/dL (ref 0–149)
VLDL Cholesterol Cal: 16 mg/dL (ref 5–40)

## 2022-10-27 LAB — CMP14+EGFR
ALT: 28 IU/L (ref 0–32)
AST: 26 IU/L (ref 0–40)
Albumin/Globulin Ratio: 1.6 (ref 1.2–2.2)
Albumin: 4.9 g/dL (ref 3.8–4.9)
Alkaline Phosphatase: 64 IU/L (ref 44–121)
BUN/Creatinine Ratio: 16 (ref 12–28)
BUN: 10 mg/dL (ref 8–27)
Bilirubin Total: 0.7 mg/dL (ref 0.0–1.2)
CO2: 20 mmol/L (ref 20–29)
Calcium: 9.3 mg/dL (ref 8.7–10.3)
Chloride: 98 mmol/L (ref 96–106)
Creatinine, Ser: 0.62 mg/dL (ref 0.57–1.00)
Globulin, Total: 3 g/dL (ref 1.5–4.5)
Glucose: 129 mg/dL — ABNORMAL HIGH (ref 70–99)
Potassium: 4.3 mmol/L (ref 3.5–5.2)
Sodium: 135 mmol/L (ref 134–144)
Total Protein: 7.9 g/dL (ref 6.0–8.5)
eGFR: 102 mL/min/{1.73_m2} (ref >59)

## 2022-10-27 LAB — MICROALBUMIN / CREATININE URINE RATIO
Creatinine, Urine: 9.9 mg/dL
Microalbumin, Urine: 4.2 ug/mL

## 2022-10-28 ENCOUNTER — Other Ambulatory Visit: Payer: Self-pay

## 2022-10-29 ENCOUNTER — Other Ambulatory Visit: Payer: Self-pay

## 2022-11-15 ENCOUNTER — Other Ambulatory Visit: Payer: Self-pay

## 2023-01-27 ENCOUNTER — Encounter: Payer: Self-pay | Admitting: Pharmacist

## 2023-01-27 ENCOUNTER — Ambulatory Visit: Payer: Medicaid Other | Attending: Family Medicine | Admitting: Pharmacist

## 2023-01-27 ENCOUNTER — Other Ambulatory Visit: Payer: Self-pay

## 2023-01-27 VITALS — BP 161/80 | HR 116

## 2023-01-27 DIAGNOSIS — I10 Essential (primary) hypertension: Secondary | ICD-10-CM | POA: Diagnosis not present

## 2023-01-27 DIAGNOSIS — Z7984 Long term (current) use of oral hypoglycemic drugs: Secondary | ICD-10-CM

## 2023-01-27 DIAGNOSIS — I152 Hypertension secondary to endocrine disorders: Secondary | ICD-10-CM | POA: Diagnosis not present

## 2023-01-27 DIAGNOSIS — E1159 Type 2 diabetes mellitus with other circulatory complications: Secondary | ICD-10-CM

## 2023-01-27 IMAGING — MG MM DIGITAL SCREENING BILAT W/ TOMO AND CAD
6 of 10 series · 6 of 30 positions shown · non-contrast
Comparison: Previous exam(s).

CLINICAL DATA: Screening.

EXAM:
DIGITAL SCREENING BILATERAL MAMMOGRAM WITH TOMOSYNTHESIS AND CAD
TECHNIQUE: Bilateral screening digital craniocaudal and mediolateral oblique
mammograms were obtained. Bilateral screening digital breast
tomosynthesis was performed. The images were evaluated with
computer-aided detection.

[L MLO synth-2D (1 of 2)]
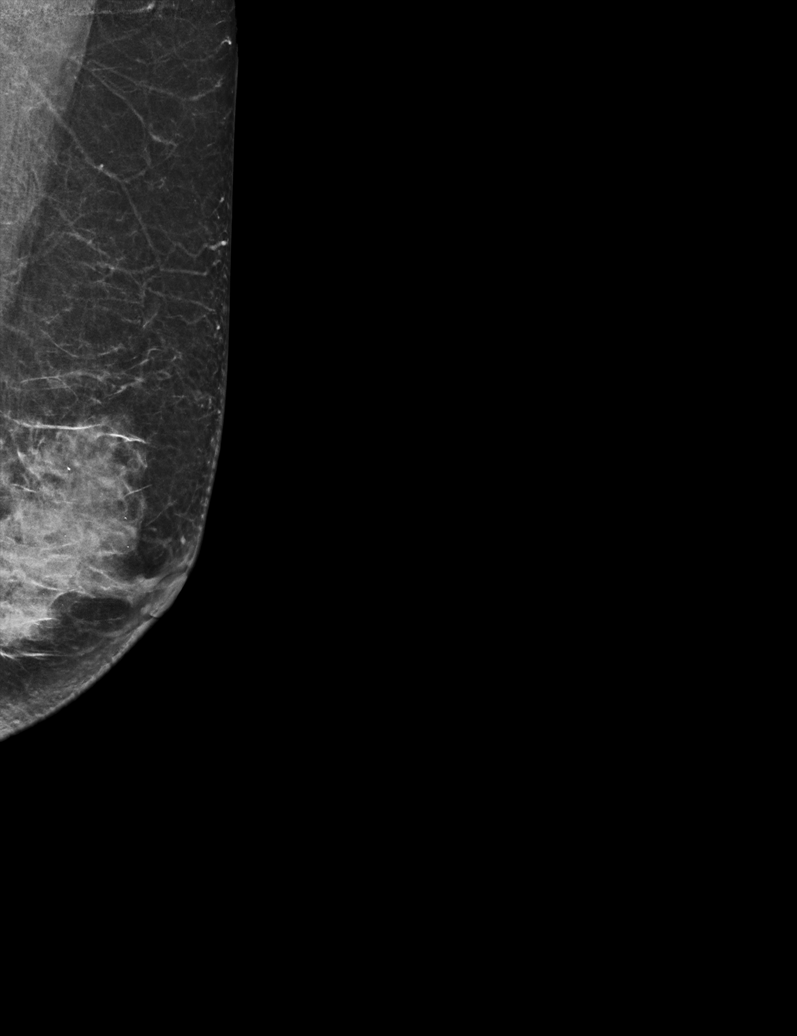

[L CC synth-2D]
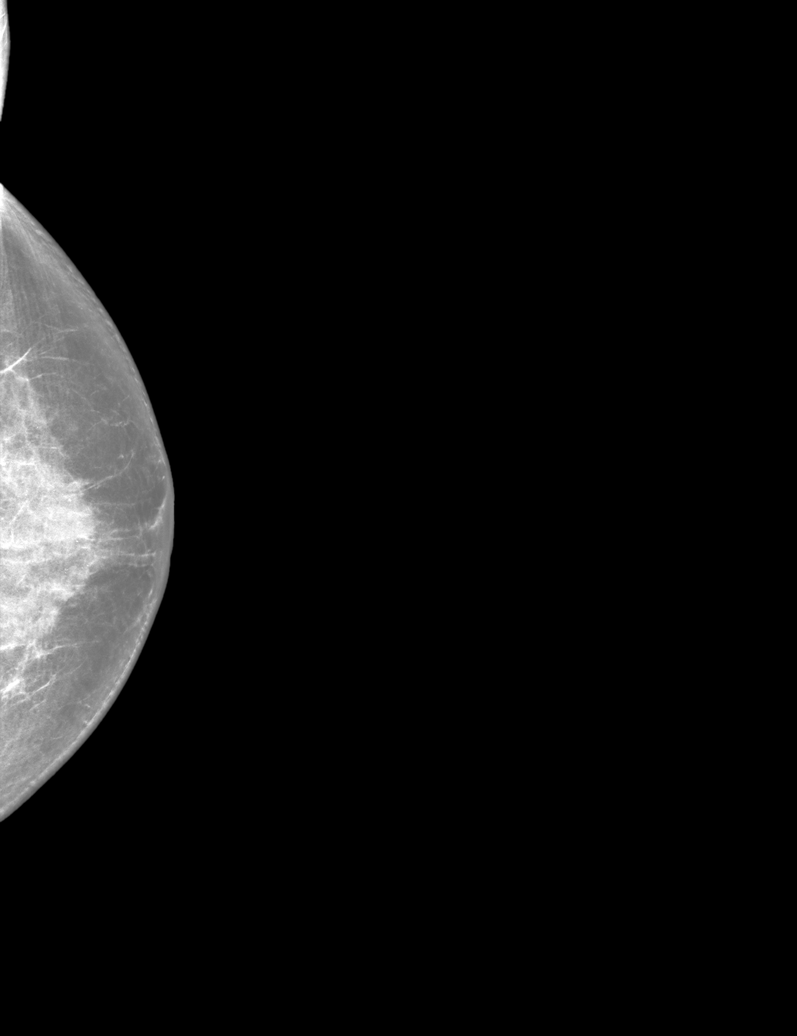

[R CC synth-2D]
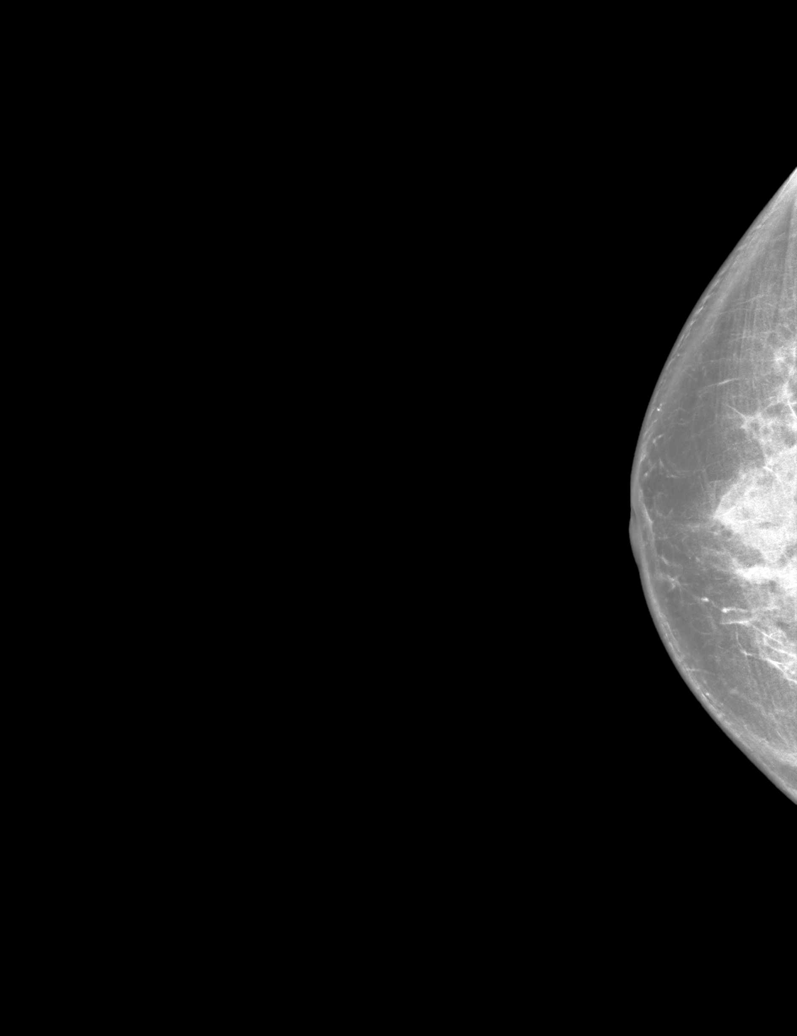

[R MLO synth-2D]
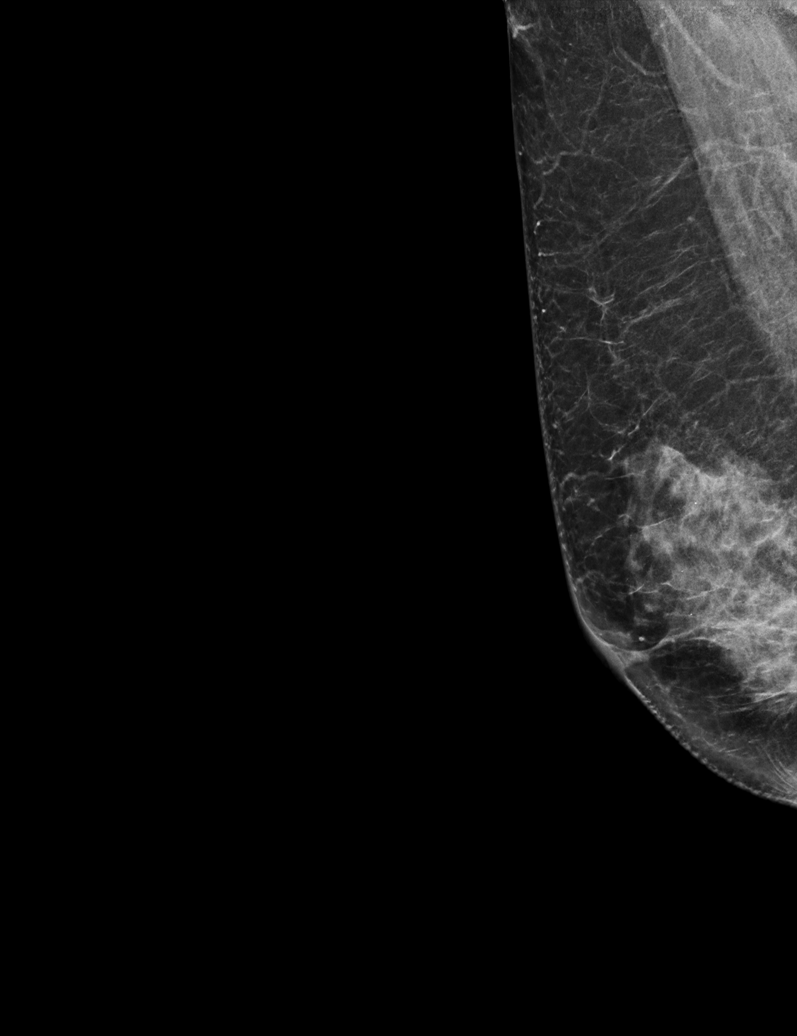

[L MLO synth-2D (2 of 2)]
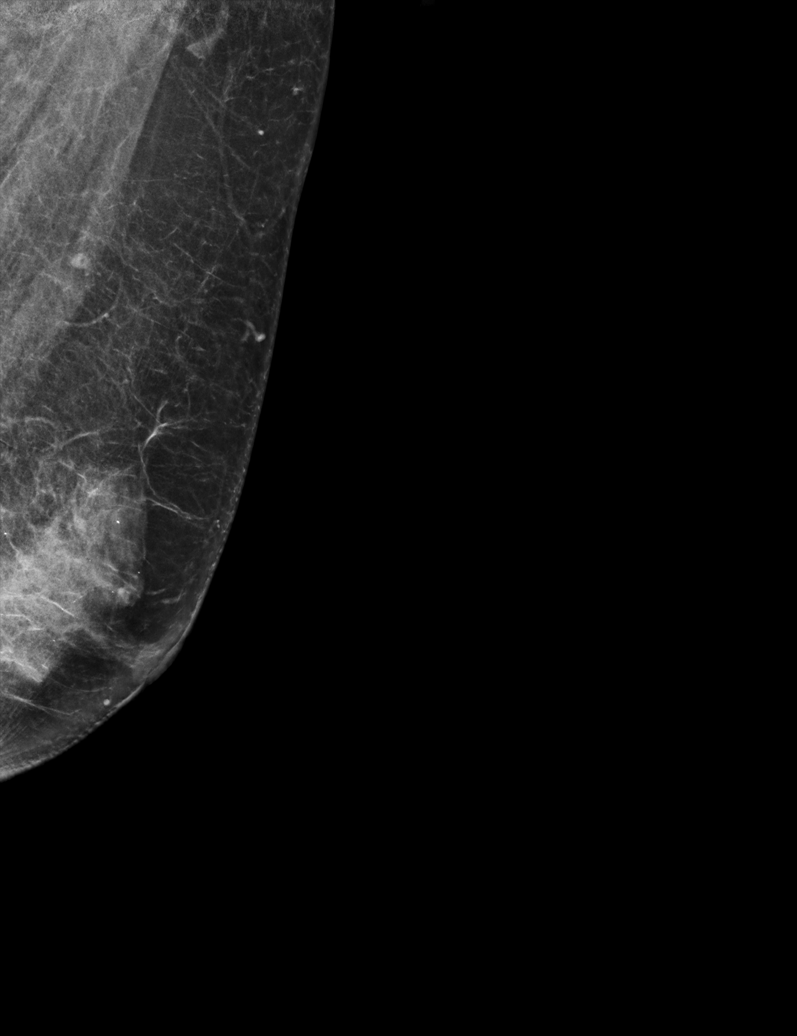

[L CC tomo · tomo slice 29/58.0]
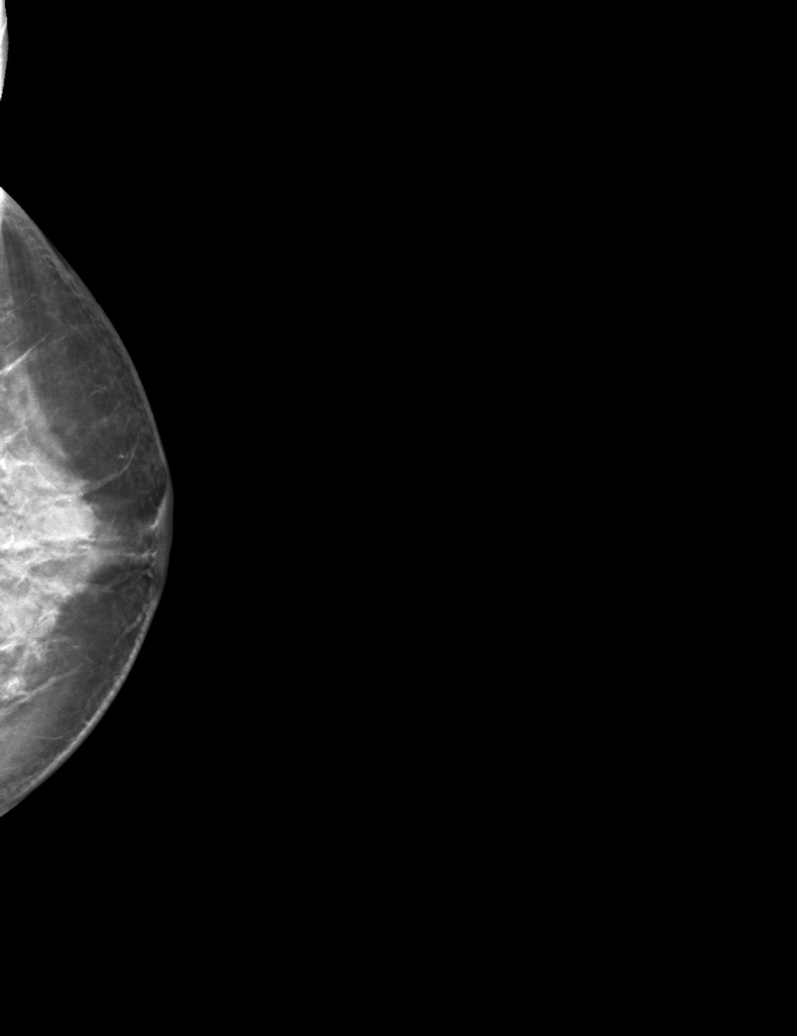

[6 of 30 positions shown; findings below may reference images not displayed]

ACR Breast Density Category d: The breast tissue is extremely dense,
which lowers the sensitivity of mammography
FINDINGS: There are no findings suspicious for malignancy.
IMPRESSION: No mammographic evidence of malignancy. A result letter of this
screening mammogram will be mailed directly to the patient.

RECOMMENDATION:
Screening mammogram in one year. (Code:TA-V-WV9)

BI-RADS CATEGORY  1: Negative.

## 2023-01-27 NOTE — Progress Notes (Signed)
    S:    PCP: Dr. Alvis Lemmings   Patient arrives in good spirits. Presents to the clinic for hypertension evaluation, counseling, and management. Patient was referred and last seen by Primary Care Provider on 10/26/2022. She was supposed to follow-up with me in 1 month for BP check, however, she made the appointment with me in 3 months, today. She has an appt to see Dr. Alvis Lemmings in a couple of weeks. Pharmacy has seen her before and her BP in clinic is consistently elevated despite goal home blood pressure.  Patient reports doing well today. Denies chest pain, dyspnea, HA or blurred vision. She does not have a BP log or her BP machine with her today.   Medication adherence reported.  Current BP Medications include: lisinopril 40 mg daily  Dietary habits include: not fully compliant with salt restriction; drinks coffee daily  Exercise habits include: walks 1-2 hours daily  Family / Social history:  FHx: DM (brother) Tobacco: never smoker  Alcohol: denies use   O:  Vitals:   01/27/23 1930  BP: (!) 161/80  Pulse: (!) 116      Home BP readings: no BP log or cuff with her today.   Last 3 Office BP readings: BP Readings from Last 3 Encounters:  01/27/23 (!) 161/80  10/26/22 (!) 171/75  04/26/22 (!) 160/88   BMET    Component Value Date/Time   NA 135 10/26/2022 1053   K 4.3 10/26/2022 1053   CL 98 10/26/2022 1053   CO2 20 10/26/2022 1053   GLUCOSE 129 (H) 10/26/2022 1053   GLUCOSE 120 (H) 07/13/2016 0936   BUN 10 10/26/2022 1053   CREATININE 0.62 10/26/2022 1053   CREATININE 0.63 07/13/2016 0936   CALCIUM 9.3 10/26/2022 1053   GFRNONAA 98 07/08/2020 1049   GFRNONAA >89 07/13/2016 0936   GFRAA 113 07/08/2020 1049   GFRAA >89 07/13/2016 0936    Renal function: CrCl cannot be calculated (Patient's most recent lab result is older than the maximum 21 days allowed.).  Clinical ASCVD: No  The 10-year ASCVD risk score (Arnett DK, et al., 2019) is: 9.2%   Values used to calculate  the score:     Age: 62 years     Sex: Female     Is Non-Hispanic African American: No     Diabetic: Yes     Tobacco smoker: No     Systolic Blood Pressure: 161 mmHg     Is BP treated: Yes     HDL Cholesterol: 68 mg/dL     Total Cholesterol: 150 mg/dL   A/P: Hypertension longstanding currently above goal in clinic. She has a confirmed dx of white coat hypertension. Unfortunately, I cannot verify her BP machine or review any home BP values today. BP Goal = < 130/80 mmHg. Will have her return in 4-6 weeks with her BP cuff for review.  -Continued current medications.  -Counseled on lifestyle modifications for blood pressure control including reduced dietary sodium, increased exercise, adequate sleep. -Return in 4-6 weeks with BP machine and BP readings from home.  Results reviewed and written information provided.   Total time in face-to-face counseling 15 minutes.   F/U with PCP.   Butch Penny, PharmD, P[place]y [place], CPP Clinical Pharmacist [facility] [phone]

## 2023-02-07 ENCOUNTER — Ambulatory Visit: Payer: Medicaid Other | Attending: Family Medicine | Admitting: Family Medicine

## 2023-02-07 VITALS — BP 145/80 | HR 109 | Ht 60.0 in | Wt 112.4 lb

## 2023-02-07 DIAGNOSIS — E785 Hyperlipidemia, unspecified: Secondary | ICD-10-CM | POA: Diagnosis not present

## 2023-02-07 DIAGNOSIS — E1159 Type 2 diabetes mellitus with other circulatory complications: Secondary | ICD-10-CM

## 2023-02-07 DIAGNOSIS — Z7984 Long term (current) use of oral hypoglycemic drugs: Secondary | ICD-10-CM

## 2023-02-07 DIAGNOSIS — E1169 Type 2 diabetes mellitus with other specified complication: Secondary | ICD-10-CM | POA: Diagnosis not present

## 2023-02-07 DIAGNOSIS — I10 Essential (primary) hypertension: Secondary | ICD-10-CM

## 2023-02-07 DIAGNOSIS — I152 Hypertension secondary to endocrine disorders: Secondary | ICD-10-CM

## 2023-02-07 DIAGNOSIS — Z1211 Encounter for screening for malignant neoplasm of colon: Secondary | ICD-10-CM

## 2023-02-07 LAB — POCT GLYCOSYLATED HEMOGLOBIN (HGB A1C): HbA1c, POC (controlled diabetic range): 6.5 % (ref 0.0–7.0)

## 2023-02-07 MED ORDER — ATORVASTATIN CALCIUM 40 MG PO TABS: 40.0000 mg | ORAL_TABLET | Freq: Every day | ORAL | 1 refills | Status: DC

## 2023-02-07 MED ORDER — CHLORTHALIDONE 15 MG PO TABS: 15.0000 mg | ORAL_TABLET | Freq: Every day | ORAL | 1 refills | Status: DC

## 2023-02-07 MED ORDER — LISINOPRIL 40 MG PO TABS: 40.0000 mg | ORAL_TABLET | Freq: Every day | ORAL | 1 refills | Status: DC

## 2023-02-07 NOTE — Patient Instructions (Signed)
Please call for your Dental appointment: Sent Referral to Dr Clarisa [facility] PH# [phone] Fax # [phone]

## 2023-02-07 NOTE — Progress Notes (Signed)
Subjective:  Patient ID: Amber Schroeder [place], female    DOB: [birth year]  Age: 62 y.o. MRN: [id number]  CC: Medical Management of Chronic Issues   HPI Amber Grant is a 62 y.o. year old female with a history of hypertension, whitecoat hypertension, hyperlipidemia-type 2 diabetes mellitus (A1c 6.5) here for chronic disease management.   Interval History: Discussed the use of AI scribe software for clinical note transcription with the patient, who gave verbal consent to proceed.  Despite adherence to lisinopril, her blood pressure remains elevated at 145/80 mmHg. At home, her blood pressure readings have been around 120/168 mmHg.  I have repeated her blood pressure with her home blood pressure monitor but BP is still slightly elevated at 142/80.  She denies any concerns or symptoms related to her hypertension.  Her diabetes is well controlled with an A1c of 6.5. She manages her diabetes through diet and lifestyle modifications, and she does not take any medications for diabetes. She also takes atorvastatin for hyperlipidemia, which has been effective in controlling her cholesterol levels.        Past Medical History:  Diagnosis Date   Hypertension     No past surgical history on file.  Family History  Problem Relation Age of Onset   Diabetes Brother    Breast cancer Neg Hx     Social History   Socioeconomic History   Marital status: Single    Spouse name: Not on file   Number of children: Not on file   Years of education: Not on file   Highest education level: Not on file  Occupational History   Not on file  Tobacco Use   Smoking status: Never   Smokeless tobacco: Never  Vaping Use   Vaping status: Never Used  Substance and Sexual Activity   Alcohol use: No   Drug use: No   Sexual activity: Not Currently  Other Topics Concern   Not on file  Social History Narrative   Not on file   Social Determinants of Health   Financial Resource Strain: Not on file  Food  Insecurity: Not on file  Transportation Needs: Not on file  Physical Activity: Unknown (05/03/2017)   Exercise Vital Sign    Days of Exercise per Week: Patient declined    Minutes of Exercise per Session: Patient declined  Stress: Not on file  Social Connections: Unknown (05/03/2017)   Social Connection and Isolation Panel [NHANES]    Frequency of Communication with Friends and Family: Patient declined    Frequency of Social Gatherings with Friends and Family: Patient declined    Attends Religious Services: Patient declined    Database administrator or Organizations: Patient declined    Attends Banker Meetings: Patient declined    Marital Status: Patient declined    No Known Allergies  Outpatient Medications Prior to Visit  Medication Sig Dispense Refill   Blood Glucose Monitoring Suppl (TRUE METRIX METER) w/Device KIT Use as directed to check blood sugar in the morning. 1 kit 0   cholecalciferol (VITAMIN D) 1000 UNITS tablet Take 1,000 Units by mouth daily.     glucose blood (TRUE METRIX BLOOD GLUCOSE TEST) test strip Use as instructed to check blood sugar in the morning. 100 each 11   Multiple Vitamins-Minerals (MULTIVITAMIN PO) Take by mouth.     TRUEplus Lancets 28G MISC Use to check blood sugar once in the morning. 100 each 11   metFORMIN (GLUCOPHAGE) 500 MG tablet Take 1 tablet (  500 mg total) by mouth daily with breakfast. 90 tablet 1   atorvastatin (LIPITOR) 40 MG tablet Take 1 tablet (40 mg total) by mouth daily. 90 tablet 1   lisinopril (ZESTRIL) 40 MG tablet Take 1 tablet (40 mg total) by mouth daily. 90 tablet 1   No facility-administered medications prior to visit.     ROS Review of Systems  Constitutional:  Negative for activity change and appetite change.  HENT:  Negative for sinus pressure and sore throat.   Respiratory:  Negative for chest tightness, shortness of breath and wheezing.   Cardiovascular:  Negative for chest pain and palpitations.   Gastrointestinal:  Negative for abdominal distention, abdominal pain and constipation.  Genitourinary: Negative.   Musculoskeletal: Negative.   Psychiatric/Behavioral:  Negative for behavioral problems and dysphoric mood.     Objective:  BP (!) 145/80   Pulse (!) 109   Ht 5' (1.524 m)   Wt 112 lb 6.4 oz (51 kg)   SpO2 99%   BMI 21.95 kg/m      02/07/2023    9:00 AM 02/07/2023    8:43 AM 01/27/2023    7:30 PM  BP/Weight  Systolic BP 145 154 161  Diastolic BP 80 78 80  Wt. (Lbs)  112.4   BMI  21.95 kg/m2       Physical Exam Constitutional:      Appearance: She is well-developed.  Cardiovascular:     Rate and Rhythm: Tachycardia present.     Heart sounds: Normal heart sounds. No murmur heard. Pulmonary:     Effort: Pulmonary effort is normal.     Breath sounds: Normal breath sounds. No wheezing or rales.  Chest:     Chest wall: No tenderness.  Abdominal:     General: Bowel sounds are normal. There is no distension.     Palpations: Abdomen is soft. There is no mass.     Tenderness: There is no abdominal tenderness.  Musculoskeletal:        General: Normal range of motion.     Right lower leg: No edema.     Left lower leg: No edema.  Neurological:     Mental Status: She is alert and oriented to person, place, and time.  Psychiatric:        Mood and Affect: Mood normal.    Diabetic Foot Exam - Simple   Simple Foot Form Visual Inspection No deformities, no ulcerations, no other skin breakdown bilaterally: Yes Sensation Testing Intact to touch and monofilament testing bilaterally: Yes Pulse Check Posterior Tibialis and Dorsalis pulse intact bilaterally: Yes Comments        Latest Ref Rng & Units 10/26/2022   10:53 AM 04/26/2022   10:27 AM 08/11/2021   10:10 AM  CMP  Glucose 70 - 99 mg/dL 062  376  283   BUN 8 - 27 mg/dL 10  15  13    Creatinine 0.57 - 1.00 mg/dL 1.51  7.61  6.07   Sodium 134 - 144 mmol/L 135  133  134   Potassium 3.5 - 5.2 mmol/L 4.3   4.4  4.9   Chloride 96 - 106 mmol/L 98  97  97   CO2 20 - 29 mmol/L 20  19  22    Calcium 8.7 - 10.3 mg/dL 9.3  9.4  9.7   Total Protein 6.0 - 8.5 g/dL 7.9  8.1  7.9   Total Bilirubin 0.0 - 1.2 mg/dL 0.7  0.5  0.7   Alkaline Phos  44 - 121 IU/L 64  65  67   AST 0 - 40 IU/L 26  28  28    ALT 0 - 32 IU/L 28  31  28      Lipid Panel     Component Value Date/Time   CHOL 150 10/26/2022 1053   TRIG 83 10/26/2022 1053   HDL 68 10/26/2022 1053   CHOLHDL 2.2 07/08/2020 1049   CHOLHDL 2.4 07/13/2016 0936   VLDL 18 07/13/2016 0936   LDLCALC 66 10/26/2022 1053    CBC    Component Value Date/Time   WBC 4.8 07/13/2016 0936   RBC 4.49 07/13/2016 0936   HGB 13.7 07/13/2016 0936   HCT 41.5 07/13/2016 0936   PLT 292 07/13/2016 0936   MCV 92.4 07/13/2016 0936   MCH 30.5 07/13/2016 0936   MCHC 33.0 07/13/2016 0936   RDW 13.2 07/13/2016 0936   LYMPHSABS 1,920 07/13/2016 0936   MONOABS 192 (L) 07/13/2016 0936   EOSABS 192 07/13/2016 0936   BASOSABS 48 07/13/2016 0936    Lab Results  Component Value Date   HGBA1C 6.5 02/07/2023    Assessment & Plan:      Hypertension Blood pressure elevated in office (145/80) despite home readings around 120/68. Currently on Lisinopril. -Add Chlorthalidone 15mg  daily. -Check blood pressure at home and follow up in 3 months. -Counseled on blood pressure goal of less than 130/80, low-sodium, DASH diet, medication compliance, 150 minutes of moderate intensity exercise per week. Discussed medication compliance, adverse effects.   Type 2 Diabetes Mellitus Well controlled with diet alone, recent A1c 6.5. -Continue dietary management. -Annual eye exam referral due to diabetes.  Hyperlipidemia Well controlled on Atorvastatin. -Continue Atorvastatin.  General Health Maintenance -Colon cancer screening due, refer for colonoscopy. -Dental referral already placed in May, provide patient with referral details.          Meds ordered this encounter   Medications   atorvastatin (LIPITOR) 40 MG tablet    Sig: Take 1 tablet (40 mg total) by mouth daily.    Dispense:  90 tablet    Refill:  1    Please dispense #90   lisinopril (ZESTRIL) 40 MG tablet    Sig: Take 1 tablet (40 mg total) by mouth daily.    Dispense:  90 tablet    Refill:  1    Dispense #90   chlorthalidone (THALITONE) 15 MG tablet    Sig: Take 1 tablet (15 mg total) by mouth daily.    Dispense:  90 tablet    Refill:  1    Follow-up: Return in about 3 months (around 05/10/2023) for Chronic medical conditions.       Hoy Register, MD, FAAFP. [facility] [place], [place] [phone]   02/07/2023, 10:04 AM

## 2023-02-08 LAB — MICROALBUMIN / CREATININE URINE RATIO
Creatinine, Urine: 22.4 mg/dL
Microalb/Creat Ratio: 15 mg/g{creat} (ref 0–29)
Microalbumin, Urine: 3.3 ug/mL

## 2023-02-15 ENCOUNTER — Other Ambulatory Visit: Payer: Self-pay

## 2023-02-22 ENCOUNTER — Other Ambulatory Visit: Payer: Self-pay | Admitting: Family Medicine

## 2023-02-22 DIAGNOSIS — Z1231 Encounter for screening mammogram for malignant neoplasm of breast: Secondary | ICD-10-CM

## 2023-03-10 NOTE — Progress Notes (Signed)
    S:     No chief complaint on file.  62 y.o. female who presents for diabetes evaluation, education, and management.   Patient was referred and last seen by Primary Care Provider, Dr. Alvis Lemmings, on 10/26/22. Patient was last seen by Pharmacy clinic on 01/27/23.   PMH is significant for whitecoat HTN & DM diagnosed 10/26/22. Metformin was started on 10/26/22 when A1c rose to 6.6%. At last visit with pharmacy on 01/27/23, BP remained elevated 161/80, all meds continued, patient instructed to return with BP readings from home. On 02/03/23 w/ Dr. Alvis Lemmings, chlorthalidone 15 mg was added & metformin was discontinued.   Today, Patient arrives in good spirits and presents without any assistance. Patient is accompanied by [place] interpreter [place], [phone]. Patient reports adherence to lisinopril & atorvastatin, never picked up chlorthalidone from Dr. Alvis Lemmings. Denies chest pain, SOB, dizziness, headache today.   Family/Social History:  FHx: DM (brother) Tobacco: never smoker  Alcohol: denies use   Current diabetes medications include: none Current hypertension medications include: lisinopril 40 mg daily Current lipid-lowering medications include: atorvastatin 40 mg daily  Patient reports adherence to taking all medications as prescribed.   Insurance coverage: medicaid  Patient reported dietary habits: not fully compliant with salt restriction; drinks coffee daily   Patient-reported exercise habits: walks 1-2 hours daily  O:   ROS  Physical Exam   Lab Results  Component Value Date   HGBA1C 6.5 02/07/2023   Vitals:   03/11/23 0924 03/11/23 0925  BP: (!) 177/95 117/68  Pulse: 97     Lipid Panel     Component Value Date/Time   CHOL 150 10/26/2022 1053   TRIG 83 10/26/2022 1053   HDL 68 10/26/2022 1053   CHOLHDL 2.2 07/08/2020 1049   CHOLHDL 2.4 07/13/2016 0936   VLDL 18 07/13/2016 0936   LDLCALC 66 10/26/2022 1053    Clinical Atherosclerotic Cardiovascular Disease (ASCVD):  No  The 10-year ASCVD risk score (Arnett DK, et al., 2019) is: 4.9%   Values used to calculate the score:     Age: 69 years     Sex: Female     Is Non-Hispanic African American: No     Diabetic: Yes     Tobacco smoker: No     Systolic Blood Pressure: 117 mmHg     Is BP treated: Yes     HDL Cholesterol: 68 mg/dL     Total Cholesterol: 150 mg/dL   Patient is participating in a Managed Medicaid Plan:  Yes   A/P: Diabetes longstanding currently controlled based on most recent A1c of 6.5%.  -Extensively discussed pathophysiology of diabetes, recommended lifestyle interventions, dietary effects on blood sugar control.  -Next A1c anticipated 04/2023.   ASCVD risk - primary prevention in patient with diabetes. Last LDL is 66 not at goal of <70 mg/dL. ASCVD risk factors include DM, HTN and 10-year ASCVD risk score of 7.5%. high intensity statin indicated.  -Continued atorvastatin 40 mg daily.   Whitecoat Hypertension longstanding currently controlled based on home BP readings. Blood pressure goal of <130/80 mmHg. Medication adherence appears appropriate. Blood pressure control is suboptimal due to whitecoat HTN. -Continued lisinopril 40 mg.  Written patient instructions provided. Patient verbalized understanding of treatment plan.  Total time in face to face counseling 30 minutes.    Follow-up:  PCP in 2 months Patient seen with Rosina Lowenstein, PharmD Candidate [facility]  Butch Penny, PharmD, [place], CPP Clinical Pharmacist [facility] [phone]

## 2023-03-11 ENCOUNTER — Ambulatory Visit: Payer: Medicaid Other | Attending: Family Medicine | Admitting: Pharmacist

## 2023-03-11 ENCOUNTER — Encounter: Payer: Self-pay | Admitting: Pharmacist

## 2023-03-11 VITALS — BP 117/68 | HR 97

## 2023-03-11 DIAGNOSIS — E1159 Type 2 diabetes mellitus with other circulatory complications: Secondary | ICD-10-CM

## 2023-03-11 DIAGNOSIS — I10 Essential (primary) hypertension: Secondary | ICD-10-CM | POA: Diagnosis not present

## 2023-03-11 DIAGNOSIS — I152 Hypertension secondary to endocrine disorders: Secondary | ICD-10-CM

## 2023-03-14 ENCOUNTER — Ambulatory Visit
Admission: RE | Admit: 2023-03-14 | Discharge: 2023-03-14 | Disposition: A | Discharge status: Home / Self Care | Payer: Medicaid Other | Source: Ambulatory Visit | Attending: Family Medicine | Admitting: Family Medicine

## 2023-03-14 DIAGNOSIS — Z1231 Encounter for screening mammogram for malignant neoplasm of breast: Secondary | ICD-10-CM | POA: Diagnosis not present

## 2023-03-24 ENCOUNTER — Encounter: Payer: Self-pay | Admitting: Family Medicine

## 2023-04-12 ENCOUNTER — Encounter: Payer: Self-pay | Admitting: Gastroenterology

## 2023-04-21 ENCOUNTER — Ambulatory Visit: Payer: Medicaid Other | Admitting: *Deleted

## 2023-04-21 VITALS — Ht 60.0 in | Wt 109.0 lb

## 2023-04-21 DIAGNOSIS — Z1211 Encounter for screening for malignant neoplasm of colon: Secondary | ICD-10-CM

## 2023-04-21 NOTE — Progress Notes (Signed)
Pt's name and DOB verified at the beginning of the pre-visit wit 2 identifiers  Pt denies any difficulty with ambulating,sitting, laying down or rolling side to side  Gave both [facility] main # and MD on call # prior to instructions.   No egg or soy allergy known to patient   No issues known to pt with past sedation with any surgeries or procedures  Patient denies ever being intubated  Pt has no issues moving head neck or swallowing  No FH of Malignant Hyperthermia  Pt is not on diet pills or shots  Pt is not on home 02   Pt is not on blood thinners   Pt denies issues with constipation   Pt is not on dialysis  Pt denise any abnormal heart rhythms   Pt denies any upcoming cardiac testing  Pt encouraged to use to use Singlecare or Goodrx to reduce cost   Patient's chart reviewed by Cathlyn Parsons CNRA prior to pre-visit and patient appropriate for the [facility].  Pre-visit completed and red dot placed by patient's name on their procedure day (on provider's schedule).  .  Visit by in person with interpretor for [place]   Pt scale weight is 109 lb  Instructed pt why it is important to and  to call if they have any changes in health or new medications. Directed them to the # given and on instructions.     Instructions reviewed with pt and pt states understanding. Instructed to review again prior to procedure. Pt states they will.   Instructions given to pt

## 2023-04-24 ENCOUNTER — Encounter: Payer: Self-pay | Admitting: Certified Registered Nurse Anesthetist

## 2023-04-29 ENCOUNTER — Encounter: Payer: Self-pay | Admitting: Gastroenterology

## 2023-04-29 ENCOUNTER — Ambulatory Visit: Payer: Medicaid Other | Admitting: Gastroenterology

## 2023-04-29 VITALS — BP 120/78 | HR 74 | Temp 97.7°F | Resp 22 | Ht 60.0 in | Wt 109.0 lb

## 2023-04-29 DIAGNOSIS — D122 Benign neoplasm of ascending colon: Secondary | ICD-10-CM

## 2023-04-29 DIAGNOSIS — Z1211 Encounter for screening for malignant neoplasm of colon: Secondary | ICD-10-CM | POA: Diagnosis not present

## 2023-04-29 DIAGNOSIS — I10 Essential (primary) hypertension: Secondary | ICD-10-CM | POA: Diagnosis not present

## 2023-04-29 DIAGNOSIS — E785 Hyperlipidemia, unspecified: Secondary | ICD-10-CM | POA: Diagnosis not present

## 2023-04-29 DIAGNOSIS — K635 Polyp of colon: Secondary | ICD-10-CM | POA: Diagnosis not present

## 2023-04-29 DIAGNOSIS — K640 First degree hemorrhoids: Secondary | ICD-10-CM | POA: Diagnosis not present

## 2023-04-29 MED ORDER — SODIUM CHLORIDE 0.9 % IV SOLN: 500.0000 mL | Freq: Once | INTRAVENOUS | Status: DC

## 2023-04-29 NOTE — Progress Notes (Signed)
Pt's states no medical or surgical changes since previsit or office visit. 

## 2023-04-29 NOTE — Progress Notes (Signed)
GASTROENTEROLOGY PROCEDURE H&P NOTE   Primary Care Physician: Hoy Register, MD    Reason for Procedure:  Colon Cancer screening  Plan:    Colonoscopy  Patient is appropriate for endoscopic procedure(s) in the ambulatory ([facility]) setting.  The nature of the procedure, as well as the risks, benefits, and alternatives were carefully and thoroughly reviewed with the patient. Ample time for discussion and questions allowed. The patient understood, was satisfied, and agreed to proceed.     HPI: Amber Grant is a 62 y.o. female who presents for colonoscopy for routine Colon Cancer screening.  No active GI symptoms.  No known family history of colon cancer or related malignancy.  Patient is otherwise without complaints or active issues today.  Past Medical History:  Diagnosis Date   Hyperlipidemia    Hypertension     Past Surgical History:  Procedure Laterality Date   CESAREAN SECTION      Prior to Admission medications   Medication Sig Start Date End Date Taking? Authorizing Provider  atorvastatin (LIPITOR) 40 MG tablet Take 1 tablet (40 mg total) by mouth daily. 02/07/23  Yes Hoy Register, MD  cholecalciferol (VITAMIN D) 1000 UNITS tablet Take 1,000 Units by mouth daily.   Yes [provider]  lisinopril (ZESTRIL) 40 MG tablet Take 1 tablet (40 mg total) by mouth daily. 02/07/23  Yes Hoy Register, MD  Multiple Vitamins-Minerals (MULTIVITAMIN PO) Take by mouth.   Yes [provider]  Blood Glucose Monitoring Suppl (TRUE METRIX METER) w/Device KIT Use as directed to check blood sugar in the morning. 07/31/18   Hoy Register, MD  chlorthalidone (THALITONE) 15 MG tablet Take 1 tablet (15 mg total) by mouth daily. Patient not taking: Reported on 04/21/2023 02/07/23   Hoy Register, MD  glucose blood (TRUE METRIX BLOOD GLUCOSE TEST) test strip Use as instructed to check blood sugar in the morning. 06/19/19   Hoy Register, MD  TRUEplus Lancets 28G MISC Use to  check blood sugar once in the morning. 11/16/21   Hoy Register, MD    Current Outpatient Medications  Medication Sig Dispense Refill   atorvastatin (LIPITOR) 40 MG tablet Take 1 tablet (40 mg total) by mouth daily. 90 tablet 1   cholecalciferol (VITAMIN D) 1000 UNITS tablet Take 1,000 Units by mouth daily.     lisinopril (ZESTRIL) 40 MG tablet Take 1 tablet (40 mg total) by mouth daily. 90 tablet 1   Multiple Vitamins-Minerals (MULTIVITAMIN PO) Take by mouth.     Blood Glucose Monitoring Suppl (TRUE METRIX METER) w/Device KIT Use as directed to check blood sugar in the morning. 1 kit 0   chlorthalidone (THALITONE) 15 MG tablet Take 1 tablet (15 mg total) by mouth daily. (Patient not taking: Reported on 04/21/2023) 90 tablet 1   glucose blood (TRUE METRIX BLOOD GLUCOSE TEST) test strip Use as instructed to check blood sugar in the morning. 100 each 11   TRUEplus Lancets 28G MISC Use to check blood sugar once in the morning. 100 each 11   Current Facility-Administered Medications  Medication Dose Route Frequency Provider Last Rate Last Admin   0.9 %  sodium chloride infusion  500 mL Intravenous Once Katelyn Kohlmeyer V, DO        Allergies as of 04/29/2023   (No Known Allergies)    Family History  Problem Relation Age of Onset   Diabetes Brother    Breast cancer Neg Hx    Colon polyps Neg Hx    Colon cancer  Neg Hx    Esophageal cancer Neg Hx    Rectal cancer Neg Hx    Stomach cancer Neg Hx     Social History   Socioeconomic History   Marital status: Married    Spouse name: Not on file   Number of children: Not on file   Years of education: Not on file   Highest education level: Not on file  Occupational History   Not on file  Tobacco Use   Smoking status: Never   Smokeless tobacco: Never  Vaping Use   Vaping status: Never Used  Substance and Sexual Activity   Alcohol use: No   Drug use: No   Sexual activity: Not Currently    Birth control/protection: Post-menopausal   Other Topics Concern   Not on file  Social History Narrative   Not on file   Social Determinants of Health   Financial Resource Strain: Not on file  Food Insecurity: Not on file  Transportation Needs: Not on file  Physical Activity: Unknown (05/03/2017)   Exercise Vital Sign    Days of Exercise per Week: Patient declined    Minutes of Exercise per Session: Patient declined  Stress: Not on file  Social Connections: Unknown (05/03/2017)   Social Connection and Isolation Panel [NHANES]    Frequency of Communication with Friends and Family: Patient declined    Frequency of Social Gatherings with Friends and Family: Patient declined    Attends Religious Services: Patient declined    Database administrator or Organizations: Patient declined    Attends Banker Meetings: Patient declined    Marital Status: Patient declined  Intimate Partner Violence: Unknown (05/03/2017)   Humiliation, Afraid, Rape, and Kick questionnaire    Fear of Current or Ex-Partner: Patient declined    Emotionally Abused: Patient declined    Physically Abused: Patient declined    Sexually Abused: Patient declined    Physical Exam: Vital signs in last 24 hours: @BP  (!) 161/92   Pulse 97   Temp 97.7 F (36.5 C) (Temporal)   Resp 18   Ht 5' (1.524 m)   Wt 109 lb (49.4 kg)   LMP  (LMP Unknown)   SpO2 98%   BMI 21.29 kg/m  GEN: NAD EYE: Sclerae anicteric ENT: MMM CV: Non-tachycardic Pulm: CTA b/l GI: Soft, NT/ND NEURO:  Alert & Oriented x 3   Doristine Locks, DO [facility]   04/29/2023 9:44 AM

## 2023-04-29 NOTE — Patient Instructions (Signed)
Handouts on polyps & hemorrhoids given to you today.   Await pathology results on polyp removed     YOU HAD AN ENDOSCOPIC PROCEDURE TODAY AT THE [facility]:   Refer to the procedure report that was given to you for any specific questions about what was found during the examination.  If the procedure report does not answer your questions, please call your gastroenterologist to clarify.  If you requested that your care partner not be given the details of your procedure findings, then the procedure report has been included in a sealed envelope for you to review at your convenience later.  YOU SHOULD EXPECT: Some feelings of bloating in the abdomen. Passage of more gas than usual.  Walking can help get rid of the air that was put into your GI tract during the procedure and reduce the bloating. If you had a lower endoscopy (such as a colonoscopy or flexible sigmoidoscopy) you may notice spotting of blood in your stool or on the toilet paper. If you underwent a bowel prep for your procedure, you may not have a normal bowel movement for a few days.  Please Note:  You might notice some irritation and congestion in your nose or some drainage.  This is from the oxygen used during your procedure.  There is no need for concern and it should clear up in a day or so.  SYMPTOMS TO REPORT IMMEDIATELY:  Following lower endoscopy (colonoscopy or flexible sigmoidoscopy):  Excessive amounts of blood in the stool  Significant tenderness or worsening of abdominal pains  Swelling of the abdomen that is new, acute  Fever of 100F or higher    For urgent or emergent issues, a gastroenterologist can be reached at any hour by calling [phone]. Do not use MyChart messaging for urgent concerns.    DIET:  We do recommend a small meal at first, but then you may proceed to your regular diet.  Drink plenty of fluids but you should avoid alcoholic beverages for 24 hours.  ACTIVITY:  You should  plan to take it easy for the rest of today and you should NOT DRIVE or use heavy machinery until tomorrow (because of the sedation medicines used during the test).    FOLLOW UP: Our staff will call the number listed on your records the next business day following your procedure.  We will call around 7:15- 8:00 am to check on you and address any questions or concerns that you may have regarding the information given to you following your procedure. If we do not reach you, we will leave a message.     If any biopsies were taken you will be contacted by phone or by letter within the next 1-3 weeks.  Please call us at [phone] if you have not heard about the biopsies in 3 weeks.    SIGNATURES/CONFIDENTIALITY: You and/or your care partner have signed paperwork which will be entered into your electronic medical record.  These signatures attest to the fact that that the information above on your After Visit Summary has been reviewed and is understood.  Full responsibility of the confidentiality of this discharge information lies with you and/or your care-partner.   HM NAY QU V? ? TH?C HI?N TH? THU?T N?I SOI T?I TRUNG TM N?I SOI Robeline: Vui lng xem bo co th? thu?t ? ???c g?i cho qu v?, n?u qu v? c b?t k? th?c m?c g trong su?t qu trnh th?m khm. N?u bo  co th? thu?t khng th? gi?i ?p th?c m?c c?a qu v?, vui lng g?i cho bc s? chuyn khoa tiu ha c?a qu v? ?? ???c gi?i ?p. N?u qu v? ? yu c?u khng cung c?p thng tin chi ti?t v? k?t qu? th? thu?t cho ??i tc ch?m [place]urz c?a qu v?, th bo co th? thu?t s? ???c g?i trong m?t phong b ???c dn kn ?? qu v? xem khi thu?n ti?n.   QU V? C TH?: C?m gic ch??ng b?ng. Trung ti?n nhi?u h?n bnh th??ng. ?i b? c th? gip ??y ra ngoi khng kh ?i vo ???ng tiu ha trong khi th?c hi?n th? thu?t v gi?m ch??ng b?ng. N?u qu v? ti?n hnh n?i soi d??i (nh? n?i soi ??i trng ho?c soi k?t trng xch-ma b?ng ?ng m?m), qu v? c th? th?y cc ch?m  mu ? phn ho?c trn gi?y v? sinh. N?u qu v? ? lm s?ch ??i trng ?? th?c hi?n th? thu?t, qu v? c th? khng ?i ??i ti?n nh? bnh th??ng trong vi ngy.  Vui Lng L?u : Qu v? c th? b? kch ?ng v ngh?t m?i ho?c ch?y n??c m?i. Tnh tr?ng ny l do ?nh h??ng c?a vi?c th? bnh oxy trong qu trnh th?c hi?n th? thu?t. Qu v? khng c?n lo l?ng, tnh tr?ng ny s? bi?n m?t sau m?t ho?c vi ngy.   CC TRI?U CH?NG C?N BO CO NGAY  Sau khi th?c hi?n n?i soi d??i (n?i soi ??i trng ho?c soi k?t trng xch-ma b?ng ?ng m?m): Phn c nhi?u mu ?au b?ng d? d?i ho?c ngy cng t?ng Xu?t hi?n v?t s?ng b?ng m?i, c?p tnh S?t t? 100F tr? ln   Sau khi th?c hi?n n?i soi trn (EGD)  Nn ra mu ho?c ch?t mu c ph s?m Xu?t hi?n c?n ?au ng?c ho?c ?au d??i x??ng b? vai m?i Nu?t ?au ho?c kh nu?t M?i b? kh th? S?t t? 100F tr? ln Phn ?en nh? m?c  ??i v?i cc v?n ?? kh?n c?p ho?c c?p c?u, qu v? c th? lin h? bc s? chuyn khoa tiu ha b?t k? lc no b?ng cch g?i ??n s? [phone].   CH? ?? ?N U?NG: Chng ti [employer] v? tr??c tin nn ?n nh?, nh?ng sau ? qu v? c th? ?n theo ch? ?? bnh th??ng. U?ng nhi?u n??c nh?ng ph?i trnh ?? u?ng c c?n trong 24 gi?.  HO?T ??NG: Qu v? c?n ln k? ho?ch ?? ngh? ng?i trong ngy hm nay v K[facility] NN LI XE ho?c s? d?ng my mc n?ng cho ??n ngy mai (do tc d?ng c?a thu?c an th?n s? d?ng trong th? thu?t).   THEO DI: Nhn vin c?a chng ti s? g?i cho qu v? theo s? ?i?n tho?i trong b?nh n vo ngy lm vi?c ti?p theo sau ngy th?c hi?n th? thu?t ?? ki?m tra tnh tr?ng c?a qu v? v gi?i ?p cc cu h?i ho?c th?c m?c c?a qu v? v? thng tin m qu v? ???c cung c?p sau khi th?c hi?n th? thu?t. N?u chng ti khng lin l?c ???c v?i qu v?, chng ti s? ?? l?i tin nh?n. Tuy nhin, n?u qu v? c?m th?y kh?e v khng g?p b?t k? s? c? no, qu v? khng c?n g?i l?i cho chng ti. Chng ti s? gi? ??nh r?ng qu v? ? tr? l?i sinh ho?t bnh th??ng v khng g?p b?t k? s? c? no.   N?u qu v? ???c l?y  sinh thi?t, chng ti s? lin l?c v?i qu v? qua ?i?n tho?i ho?c th? trong 1-3 tu?n ti?p theo. Vui lng g?i cho chng ti theo s? [phone] n?u qu v? khng nh?n ???c thng tin v? k?t qu? sinh thi?t trong 3 tu?n.  CH? K/B?O M?TLadell Heads v? v/ho?c ??i tc ch?m  c?a qu v? ? k vo cc ti li?u s? ???c nh?p vo h? s? y t? ?i?n t? c?a qu v?. Ch? k ny xc nh?n r?ng cc thng tin trn ?y trong B?n Tm T?t Sau Khi Th?m Khm c?a qu v? ? ???c xem xt v hi?u r. Qu v? v/ho?c

## 2023-04-29 NOTE — Progress Notes (Signed)
Report given to PACU, vss 

## 2023-04-29 NOTE — Op Note (Signed)
[facility] Patient Name: Amber Grant My Krenzel Procedure Date: 04/29/2023 9:31 AM MRN: [id number] Endoscopist: Doristine Locks , MD, [id number] Age: 62 Referring MD:  Date of Birth: 11/27/1960 Gender: Female Account #: [id number] Procedure:                Colonoscopy Indications:              Screening for colorectal malignant neoplasm, This                            is the patient's first colonoscopy Medicines:                Monitored Anesthesia Care Procedure:                Pre-Anesthesia Assessment:                           - Prior to the procedure, a History and Physical                            was performed, and patient medications and                            allergies were reviewed. The patient's tolerance of                            previous anesthesia was also reviewed. The risks                            and benefits of the procedure and the sedation                            options and risks were discussed with the patient.                            All questions were answered, and informed consent                            was obtained. Prior Anticoagulants: The patient has                            taken no anticoagulant or antiplatelet agents. ASA                            Grade Assessment: II - A patient with mild systemic                            disease. After reviewing the risks and benefits,                            the patient was deemed in satisfactory condition to                            undergo the procedure.  After obtaining informed consent, the colonoscope                            was passed under direct vision. Throughout the                            procedure, the patient's blood pressure, pulse, and                            oxygen saturations were monitored continuously. The                            CF [id number] #[id number] was introduced through the anus                            and advanced to the  the terminal ileum. The                            colonoscopy was performed without difficulty. The                            patient tolerated the procedure well. The quality                            of the bowel preparation was good. The terminal                            ileum, ileocecal valve, appendiceal orifice, and                            rectum were photographed. Scope In: 9:48:54 AM Scope Out: 10:01:57 AM Scope Withdrawal Time: 0 hours 10 minutes 53 seconds  Total Procedure Duration: 0 hours 13 minutes 3 seconds  Findings:                 Skin tags were found on perianal exam.                           A 2 mm polyp was found in the ascending colon. The                            polyp was sessile. The polyp was removed with a                            cold snare. Resection and retrieval were complete.                            Estimated blood loss was minimal.                           The exam was otherwise normal throughout the                            remainder of the colon.  Non-bleeding internal hemorrhoids were found during                            retroflexion. The hemorrhoids were small.                           The terminal ileum appeared normal. Complications:            No immediate complications. Estimated Blood Loss:     Estimated blood loss was minimal. Impression:               - Perianal skin tags found on perianal exam.                           - One 2 mm polyp in the ascending colon, removed                            with a cold snare. Resected and retrieved.                           - Non-bleeding internal hemorrhoids.                           - The examined portion of the ileum was normal. Recommendation:           - Patient has a contact number available for                            emergencies. The signs and symptoms of potential                            delayed complications were discussed with the                             patient. Return to normal activities tomorrow.                            Written discharge instructions were provided to the                            patient.                           - Resume previous diet.                           - Continue present medications.                           - Await pathology results.                           - Repeat colonoscopy for surveillance based on                            pathology results.                           -  Return to GI office PRN. Doristine Locks, MD 04/29/2023 10:05:12 AM

## 2023-04-29 NOTE — Progress Notes (Signed)
Called to room to assist during endoscopic procedure.  Patient ID and intended procedure confirmed with present staff. Received instructions for my participation in the procedure from the performing physician.  

## 2023-05-02 ENCOUNTER — Telehealth: Payer: Self-pay

## 2023-05-02 NOTE — Telephone Encounter (Signed)
  Follow up Call-     04/29/2023    8:51 AM  Call back number  Post procedure Call Back phone  # [phone]- Amber Grant husband speaks some english  Permission to leave phone message Yes     Patient questions:  Do you have a fever, pain , or abdominal swelling? No. Pain Score  0 *  Have you tolerated food without any problems? Yes.    Have you been able to return to your normal activities? Yes.    Do you have any questions about your discharge instructions: Diet   No. Medications  No. Follow up visit  No.  Do you have questions or concerns about your Care? No.  Actions: * If pain score is 4 or above: No action needed, pain <4.

## 2023-05-03 ENCOUNTER — Ambulatory Visit: Payer: Medicaid Other | Attending: Family Medicine | Admitting: Family Medicine

## 2023-05-03 VITALS — BP 150/65 | HR 98 | Ht 60.0 in | Wt 108.0 lb

## 2023-05-03 DIAGNOSIS — Z7984 Long term (current) use of oral hypoglycemic drugs: Secondary | ICD-10-CM | POA: Diagnosis not present

## 2023-05-03 DIAGNOSIS — E1169 Type 2 diabetes mellitus with other specified complication: Secondary | ICD-10-CM | POA: Diagnosis not present

## 2023-05-03 LAB — POCT GLYCOSYLATED HEMOGLOBIN (HGB A1C): HbA1c, POC (controlled diabetic range): 6 % (ref 0.0–7.0)

## 2023-05-03 NOTE — Patient Instructions (Signed)
Managing Your Hypertension Hypertension, also called high blood pressure, is when the force of the blood pressing against the walls of the arteries is too strong. Arteries are blood vessels that carry blood from your heart throughout your body. Hypertension forces the heart to work harder to pump blood and may cause the arteries to become narrow or stiff. Understanding blood pressure readings A blood pressure reading includes a higher number over a lower number: The first, or top, number is called the systolic pressure. It is a measure of the pressure in your arteries as your heart beats. The second, or bottom number, is called the diastolic pressure. It is a measure of the pressure in your arteries as the heart relaxes. For most people, a normal blood pressure is below 120/80. Your personal target blood pressure may vary depending on your medical conditions, your age, and other factors. Blood pressure is classified into four stages. Based on your blood pressure reading, your health care provider may use the following stages to determine what type of treatment you need, if any. Systolic pressure and diastolic pressure are measured in a unit called millimeters of mercury (mmHg). Normal Systolic pressure: below 120. Diastolic pressure: below 80. Elevated Systolic pressure: 120-129. Diastolic pressure: below 80. Hypertension stage 1 Systolic pressure: 130-139. Diastolic pressure: 80-89. Hypertension stage 2 Systolic pressure: 140 or above. Diastolic pressure: 90 or above. How can this condition affect me? Managing your hypertension is very important. Over time, hypertension can damage the arteries and decrease blood flow to parts of the body, including the brain, heart, and kidneys. Having untreated or uncontrolled hypertension can lead to: A heart attack. A stroke. A weakened blood vessel (aneurysm). Heart failure. Kidney damage. Eye damage. Memory and concentration problems. Vascular  dementia. What actions can I take to manage this condition? Hypertension can be managed by making lifestyle changes and possibly by taking medicines. Your health care provider will help you make a plan to bring your blood pressure within a normal range. You may be referred for counseling on a healthy diet and physical activity. Nutrition  Eat a diet that is high in fiber and potassium, and low in salt (sodium), added sugar, and fat. An example eating plan is called the DASH diet. DASH stands for Dietary Approaches to Stop Hypertension. To eat this way: Eat plenty of fresh fruits and vegetables. Try to fill one-half of your plate at each meal with fruits and vegetables. Eat whole grains, such as whole-wheat pasta, brown rice, or whole-grain bread. Fill about one-fourth of your plate with whole grains. Eat low-fat dairy products. Avoid fatty cuts of meat, processed or cured meats, and poultry with skin. Fill about one-fourth of your plate with lean proteins such as fish, chicken without skin, beans, eggs, and tofu. Avoid pre-made and processed foods. These tend to be higher in sodium, added sugar, and fat. Reduce your daily sodium intake. Many people with hypertension should eat less than 1,500 mg of sodium a day. Lifestyle  Work with your health care provider to maintain a healthy body weight or to lose weight. Ask what an ideal weight is for you. Get at least 30 minutes of exercise that causes your heart to beat faster (aerobic exercise) most days of the week. Activities may include walking, swimming, or biking. Include exercise to strengthen your muscles (resistance exercise), such as weight lifting, as part of your weekly exercise routine. Try to do these types of exercises for 30 minutes at least 3 days a week. Do   not use any products that contain nicotine or tobacco. These products include cigarettes, chewing tobacco, and vaping devices, such as e-cigarettes. If you need help quitting, ask your  health care provider. Control any long-term (chronic) conditions you have, such as high cholesterol or diabetes. Identify your sources of stress and find ways to manage stress. This may include meditation, deep breathing, or making time for fun activities. Alcohol use Do not drink alcohol if: Your health care provider tells you not to drink. You are pregnant, may be pregnant, or are planning to become pregnant. If you drink alcohol: Limit how much you have to: 0-1 drink a day for women. 0-2 drinks a day for men. Know how much alcohol is in your drink. In the [place] one drink equals one 12 oz bottle of beer (355 mL), one 5 oz glass of wine (148 mL), or one 1 oz glass of hard liquor (44 mL). Medicines Your health care provider may prescribe medicine if lifestyle changes are not enough to get your blood pressure under control and if: Your systolic blood pressure is 130 or higher. Your diastolic blood pressure is 80 or higher. Take medicines only as told by your health care provider. Follow the directions carefully. Blood pressure medicines must be taken as told by your health care provider. The medicine does not work as well when you skip doses. Skipping doses also puts you at risk for problems. Monitoring Before you monitor your blood pressure: Do not smoke, drink caffeinated beverages, or exercise within 30 minutes before taking a measurement. Use the bathroom and empty your bladder (urinate). Sit quietly for at least 5 minutes before taking measurements. Monitor your blood pressure at home as told by your health care provider. To do this: Sit with your back straight and supported. Place your feet flat on the floor. Do not cross your legs. Support your arm on a flat surface, such as a table. Make sure your upper arm is at heart level. Each time you measure, take two or three readings one minute apart and record the results. You may also need to have your blood pressure checked regularly by  your health care provider. General information Talk with your health care provider about your diet, exercise habits, and other lifestyle factors that may be contributing to hypertension. Review all the medicines you take with your health care provider because there may be side effects or interactions. Keep all follow-up visits. Your health care provider can help you create and adjust your plan for managing your high blood pressure. Where to find more information National Heart, Lung, and Blood Institute: www.nhlbi.nih.gov American Heart Association: www.heart.org Contact a health care provider if: You think you are having a reaction to medicines you have taken. You have repeated (recurrent) headaches. You feel dizzy. You have swelling in your ankles. You have trouble with your vision. Get help right away if: You develop a severe headache or confusion. You have unusual weakness or numbness, or you feel faint. You have severe pain in your chest or abdomen. You vomit repeatedly. You have trouble breathing. These symptoms may be an emergency. Get help right away. Call 911. Do not wait to see if the symptoms will go away. Do not drive yourself to the hospital. Summary Hypertension is when the force of blood pumping through your arteries is too strong. If this condition is not controlled, it may put you at risk for serious complications. Your personal target blood pressure may vary depending on your medical conditions,   your age, and other factors. For most people, a normal blood pressure is less than 120/80. Hypertension is managed by lifestyle changes, medicines, or both. Lifestyle changes to help manage hypertension include losing weight, eating a healthy, low-sodium diet, exercising more, stopping smoking, and limiting alcohol. This information is not intended to replace advice given to you by your health care provider. Make sure you discuss any questions you have with your health care  provider. Document Revised: 02/12/2021 Document Reviewed: 02/12/2021 Elsevier Patient Education  2024 [employer]  

## 2023-05-03 NOTE — Progress Notes (Signed)
Subjective:  Patient ID: Amber Schroeder My Ask[place], female    DOB: 1961/04/09  Age: 62 y.o. MRN: [id number]  CC: Medical Management of Chronic Issues   HPI Amber Grant is a 62 y.o. year old female with a history of hypertension, whitecoat hypertension, hyperlipidemia-type 2 diabetes mellitus (A1c 6.0) here for chronic disease management.   Interval History: Discussed the use of AI scribe software for clinical note transcription with the patient, who gave verbal consent to proceed.  Her home blood pressure readings have been within normal limits, with values such as 123/71 and 112/67. However, in the clinic, her blood pressure readings are consistently elevated, with a reading today of 150/65. The patient's blood pressure monitor was checked for accuracy and found to be reliable with a BP of 147/90.  She is currently on one medication for hypertension, Lisinopril 40mg  daily but not taking chlorthalidone which appears on her med list.  Regarding her diabetes, the patient's A1c is well controlled at 6.0, and she is not currently on any medication for diabetes.        Past Medical History:  Diagnosis Date   Hyperlipidemia    Hypertension     Past Surgical History:  Procedure Laterality Date   CESAREAN SECTION      Family History  Problem Relation Age of Onset   Diabetes Brother    Breast cancer Neg Hx    Colon polyps Neg Hx    Colon cancer Neg Hx    Esophageal cancer Neg Hx    Rectal cancer Neg Hx    Stomach cancer Neg Hx     Social History   Socioeconomic History   Marital status: Married    Spouse name: Not on file   Number of children: Not on file   Years of education: Not on file   Highest education level: Not on file  Occupational History   Not on file  Tobacco Use   Smoking status: Never   Smokeless tobacco: Never  Vaping Use   Vaping status: Never Used  Substance and Sexual Activity   Alcohol use: No   Drug use: No   Sexual activity: Not Currently    Birth  control/protection: Post-menopausal  Other Topics Concern   Not on file  Social History Narrative   Not on file   Social Determinants of Health   Financial Resource Strain: Not on file  Food Insecurity: Not on file  Transportation Needs: Not on file  Physical Activity: Unknown (05/03/2017)   Exercise Vital Sign    Days of Exercise per Week: Patient declined    Minutes of Exercise per Session: Patient declined  Stress: Not on file  Social Connections: Unknown (05/03/2017)   Social Connection and Isolation Panel [NHANES]    Frequency of Communication with Friends and Family: Patient declined    Frequency of Social Gatherings with Friends and Family: Patient declined    Attends Religious Services: Patient declined    Database administrator or Organizations: Patient declined    Attends Banker Meetings: Patient declined    Marital Status: Patient declined    No Known Allergies  Outpatient Medications Prior to Visit  Medication Sig Dispense Refill   atorvastatin (LIPITOR) 40 MG tablet Take 1 tablet (40 mg total) by mouth daily. 90 tablet 1   Blood Glucose Monitoring Suppl (TRUE METRIX METER) w/Device KIT Use as directed to check blood sugar in the morning. 1 kit 0   cholecalciferol (VITAMIN D) 1000 UNITS tablet  Take 1,000 Units by mouth daily.     glucose blood (TRUE METRIX BLOOD GLUCOSE TEST) test strip Use as instructed to check blood sugar in the morning. 100 each 11   lisinopril (ZESTRIL) 40 MG tablet Take 1 tablet (40 mg total) by mouth daily. 90 tablet 1   Multiple Vitamins-Minerals (MULTIVITAMIN PO) Take by mouth.     TRUEplus Lancets 28G MISC Use to check blood sugar once in the morning. 100 each 11   chlorthalidone (THALITONE) 15 MG tablet Take 1 tablet (15 mg total) by mouth daily. (Patient not taking: Reported on 04/21/2023) 90 tablet 1   No facility-administered medications prior to visit.     ROS Review of Systems  Constitutional:  Negative for  activity change and appetite change.  HENT:  Negative for sinus pressure and sore throat.   Respiratory:  Negative for chest tightness, shortness of breath and wheezing.   Cardiovascular:  Negative for chest pain and palpitations.  Gastrointestinal:  Negative for abdominal distention, abdominal pain and constipation.  Genitourinary: Negative.   Musculoskeletal: Negative.   Psychiatric/Behavioral:  Negative for behavioral problems and dysphoric mood.     Objective:  BP (!) 150/65   Pulse 98   Ht 5' (1.524 m)   Wt 108 lb (49 kg)   LMP  (LMP Unknown)   SpO2 100%   BMI 21.09 kg/m      05/03/2023    8:57 AM 05/03/2023    8:44 AM 04/29/2023   10:26 AM  BP/Weight  Systolic BP 150 164 120  Diastolic BP 65 79 78  Wt. (Lbs)  108   BMI  21.09 kg/m2       Physical Exam Constitutional:      Appearance: She is well-developed.  Cardiovascular:     Rate and Rhythm: Normal rate.     Heart sounds: Normal heart sounds. No murmur heard. Pulmonary:     Effort: Pulmonary effort is normal.     Breath sounds: Normal breath sounds. No wheezing or rales.  Chest:     Chest wall: No tenderness.  Abdominal:     General: Bowel sounds are normal. There is no distension.     Palpations: Abdomen is soft. There is no mass.     Tenderness: There is no abdominal tenderness.  Musculoskeletal:        General: Normal range of motion.     Right lower leg: No edema.     Left lower leg: No edema.  Neurological:     Mental Status: She is alert and oriented to person, place, and time.  Psychiatric:        Mood and Affect: Mood normal.        Latest Ref Rng & Units 10/26/2022   10:53 AM 04/26/2022   10:27 AM 08/11/2021   10:10 AM  CMP  Glucose 70 - 99 mg/dL 914  782  956   BUN 8 - 27 mg/dL 10  15  13    Creatinine 0.57 - 1.00 mg/dL 2.13  0.86  5.78   Sodium 134 - 144 mmol/L 135  133  134   Potassium 3.5 - 5.2 mmol/L 4.3  4.4  4.9   Chloride 96 - 106 mmol/L 98  97  97   CO2 20 - 29 mmol/L 20   19  22    Calcium 8.7 - 10.3 mg/dL 9.3  9.4  9.7   Total Protein 6.0 - 8.5 g/dL 7.9  8.1  7.9   Total Bilirubin 0.0 -  1.2 mg/dL 0.7  0.5  0.7   Alkaline Phos 44 - 121 IU/L 64  65  67   AST 0 - 40 IU/L 26  28  28    ALT 0 - 32 IU/L 28  31  28      Lipid Panel     Component Value Date/Time   CHOL 150 10/26/2022 1053   TRIG 83 10/26/2022 1053   HDL 68 10/26/2022 1053   CHOLHDL 2.2 07/08/2020 1049   CHOLHDL 2.4 07/13/2016 0936   VLDL 18 07/13/2016 0936   LDLCALC 66 10/26/2022 1053    CBC    Component Value Date/Time   WBC 4.8 07/13/2016 0936   RBC 4.49 07/13/2016 0936   HGB 13.7 07/13/2016 0936   HCT 41.5 07/13/2016 0936   PLT 292 07/13/2016 0936   MCV 92.4 07/13/2016 0936   MCH 30.5 07/13/2016 0936   MCHC 33.0 07/13/2016 0936   RDW 13.2 07/13/2016 0936   LYMPHSABS 1,920 07/13/2016 0936   MONOABS 192 (L) 07/13/2016 0936   EOSABS 192 07/13/2016 0936   BASOSABS 48 07/13/2016 0936    Lab Results  Component Value Date   HGBA1C 6.0 05/03/2023    Assessment & Plan:      Hypertension She does have white coat hypertension Elevated blood pressure readings in clinic, despite normal readings at home. Currently on Lisinopril 40mg  daily.  Home blood pressures have always remained normal in the 100-120 systolic range hence I am hesitant to have her restart chlorthalidone to prevent hypotension -Refer to clinic pharmacist for blood pressure regimen review and recheck.  If blood pressure remains at goal we will discontinue chlorthalidone altogether. -Continue Lisinopril 40mg  daily. -Counseled on blood pressure goal of less than 130/80, low-sodium, DASH diet, medication compliance, 150 minutes of moderate intensity exercise per week. Discussed medication compliance, adverse effects.    Type 2 Diabetes Mellitus Well controlled with A1c of 6.0, not on any medication. -Continue current management. -Order routine blood work.  Medication refill Request for 90-day supply of current  medication. -Refill Lisinopril 40mg  for 90 days.          No orders of the defined types were placed in this encounter.   Follow-up: Return in about 3 months (around 08/03/2023) for Blood pressure follow-up with Franky Macho, Medical conditions with PCP.       Hoy Register, MD, FAAFP. [facility] [place], [place] [phone]   05/03/2023, 9:32 AM

## 2023-05-04 LAB — CMP14+EGFR
ALT: 25 [IU]/L (ref 0–32)
AST: 36 [IU]/L (ref 0–40)
Albumin: 4.8 g/dL (ref 3.8–4.9)
Alkaline Phosphatase: 56 [IU]/L (ref 44–121)
BUN/Creatinine Ratio: 15 (ref 12–28)
BUN: 10 mg/dL (ref 8–27)
Bilirubin Total: 0.5 mg/dL (ref 0.0–1.2)
Calcium: 9.5 mg/dL (ref 8.7–10.3)
Chloride: 102 mmol/L (ref 96–106)
Creatinine, Ser: 0.68 mg/dL (ref 0.57–1.00)
Globulin, Total: 3.1 g/dL (ref 1.5–4.5)
Glucose: 123 mg/dL — ABNORMAL HIGH (ref 70–99)
Potassium: 5.2 mmol/L (ref 3.5–5.2)
Sodium: 141 mmol/L (ref 134–144)
Total Protein: 7.9 g/dL (ref 6.0–8.5)
eGFR: 100 mL/min/{1.73_m2} (ref >59)

## 2023-05-05 LAB — SURGICAL PATHOLOGY

## 2023-08-04 ENCOUNTER — Ambulatory Visit: Payer: Medicaid Other | Admitting: Pharmacist

## 2023-08-07 NOTE — Progress Notes (Unsigned)
 S:     No chief complaint on file.  63 y.o. female who presents for diabetes evaluation, education, and management. PMH is significant for whitecoat HTN, HLD & DM diagnosed 10/26/22 (last A1c 6.0% without medication).  Patient was referred and last seen by Primary Care Provider, Dr. Alvis Lemmings, on 05/03/23. Patient was last seen by Pharmacy clinic on 02/2723.   At last visit with pharmacy, BP controlled at 117/68 and lisinopril 40 mg PO daily was continued. Elevated to 150/65 at last PCP appointment, but home readings were controlled. Patient brought her home BP cuff which was found to be accurate. Dr. Alvis Lemmings noted that patient was not taking chlorthalidone, despite it being on her medication list. However, given controlled home readings, she recommended continuing lisinopril alone.   Today, Patient arrives in good spirits and presents without any assistance***. Patient is accompanied by [place] interpreter [place], #***. Patient reports adherence to lisinopril & atorvastatin***, never picked up chlorthalidone from Dr. Alvis Lemmings. Denies chest pain, SOB, dizziness, headache today.***  Family/Social History:  FHx: DM (brother) Tobacco: never smoker  Alcohol: denies use   Current diabetes medications include: none Current hypertension medications include: lisinopril 40 mg daily Current lipid-lowering medications include: atorvastatin 40 mg daily  Patient reports adherence to taking all medications as prescribed. Dispense history OK. ***pharmacy?  Insurance coverage: medicaid  Patient reported dietary habits: not fully compliant with salt restriction; drinks coffee daily ***  Patient-reported exercise habits: walks 1-2 hours daily***  O:   ROS  Physical Exam   Lab Results  Component Value Date   HGBA1C 6.0 05/03/2023   There were no vitals filed for this visit.   Lipid Panel     Component Value Date/Time   CHOL 150 10/26/2022 1053   TRIG 83 10/26/2022 1053   HDL 68  10/26/2022 1053   CHOLHDL 2.2 07/08/2020 1049   CHOLHDL 2.4 07/13/2016 0936   VLDL 18 07/13/2016 0936   LDLCALC 66 10/26/2022 1053    Clinical Atherosclerotic Cardiovascular Disease (ASCVD): No  The 10-year ASCVD risk score (Arnett DK, et al., 2019) is: 9.1%   Values used to calculate the score:     Age: 35 years     Sex: Female     Is Non-Hispanic African American: No     Diabetic: Yes     Tobacco smoker: No     Systolic Blood Pressure: 150 mmHg     Is BP treated: Yes     HDL Cholesterol: 68 mg/dL     Total Cholesterol: 150 mg/dL   Patient is participating in a Managed Medicaid Plan:  Yes   K was 5.2 in Nov 2024 > repeat BMP today?  A/P: Diabetes longstanding currently controlled based on most recent A1c of 6.0%, improved from 6.5% with lifestyle changes alone.*** -Extensively discussed pathophysiology of diabetes, recommended lifestyle interventions, dietary effects on blood sugar control.  -Next A1c anticipated May 2025  ASCVD risk - primary prevention in patient with diabetes. Last LDL is 66 controlled below goal of <70 mg/dL. ASCVD risk factors include DM, HTN and 10-year ASCVD risk score of 7.5%. Appropriate to continue high intensity statin. -Continued atorvastatin 40 mg daily.   Whitecoat Hypertension longstanding currently controlled based on home BP readings. Blood pressure goal of <130/80 mmHg. Medication adherence appears appropriate. Blood pressure control is suboptimal due to whitecoat HTN.*** -Continued lisinopril 40 mg.  Written patient instructions provided. Patient verbalized understanding of treatment plan.  Total time in face to face counseling 30 minutes.  Follow-up:  PCP in 3 months*** Pharmacist ?***  Nils Pyle, PharmD PGY1 Pharmacy Resident  Butch Penny, PharmD, BCACP, CPP Clinical Pharmacist [facility] [phone]

## 2023-08-08 ENCOUNTER — Encounter: Payer: Self-pay | Admitting: Pharmacist

## 2023-08-08 ENCOUNTER — Ambulatory Visit: Payer: Medicaid Other | Attending: Family Medicine | Admitting: Pharmacist

## 2023-08-08 VITALS — BP 121/70 | HR 104

## 2023-08-08 DIAGNOSIS — E1169 Type 2 diabetes mellitus with other specified complication: Secondary | ICD-10-CM | POA: Diagnosis not present

## 2023-08-08 DIAGNOSIS — E1159 Type 2 diabetes mellitus with other circulatory complications: Secondary | ICD-10-CM | POA: Diagnosis not present

## 2023-08-08 DIAGNOSIS — E785 Hyperlipidemia, unspecified: Secondary | ICD-10-CM | POA: Diagnosis not present

## 2023-08-08 DIAGNOSIS — I10 Essential (primary) hypertension: Secondary | ICD-10-CM | POA: Diagnosis not present

## 2023-08-08 DIAGNOSIS — I152 Hypertension secondary to endocrine disorders: Secondary | ICD-10-CM | POA: Diagnosis not present

## 2023-08-08 MED ORDER — ATORVASTATIN CALCIUM 40 MG PO TABS: 40.0000 mg | ORAL_TABLET | Freq: Every day | ORAL | 3 refills | Status: DC

## 2023-08-08 MED ORDER — LISINOPRIL 40 MG PO TABS: 40.0000 mg | ORAL_TABLET | Freq: Every day | ORAL | 3 refills | Status: DC

## 2023-08-09 LAB — BASIC METABOLIC PANEL
BUN/Creatinine Ratio: 22 (ref 12–28)
BUN: 14 mg/dL (ref 8–27)
CO2: 22 mmol/L (ref 20–29)
Calcium: 9.4 mg/dL (ref 8.7–10.3)
Chloride: 100 mmol/L (ref 96–106)
Creatinine, Ser: 0.63 mg/dL (ref 0.57–1.00)
Glucose: 141 mg/dL — ABNORMAL HIGH (ref 70–99)
Potassium: 5.2 mmol/L (ref 3.5–5.2)
Sodium: 139 mmol/L (ref 134–144)
eGFR: 101 mL/min/{1.73_m2} (ref >59)

## 2023-10-19 LAB — HM DIABETES EYE EXAM

## 2023-11-08 ENCOUNTER — Ambulatory Visit: Payer: Medicaid Other | Admitting: Family Medicine

## 2023-12-13 ENCOUNTER — Encounter: Payer: Self-pay | Admitting: Family Medicine

## 2023-12-13 ENCOUNTER — Ambulatory Visit: Attending: Family Medicine | Admitting: Family Medicine

## 2023-12-13 VITALS — BP 152/75 | HR 105 | Ht 60.0 in | Wt 110.8 lb

## 2023-12-13 DIAGNOSIS — I152 Hypertension secondary to endocrine disorders: Secondary | ICD-10-CM

## 2023-12-13 DIAGNOSIS — Z23 Encounter for immunization: Secondary | ICD-10-CM

## 2023-12-13 DIAGNOSIS — I10 Essential (primary) hypertension: Secondary | ICD-10-CM

## 2023-12-13 DIAGNOSIS — E1169 Type 2 diabetes mellitus with other specified complication: Secondary | ICD-10-CM

## 2023-12-13 DIAGNOSIS — E785 Hyperlipidemia, unspecified: Secondary | ICD-10-CM | POA: Diagnosis not present

## 2023-12-13 LAB — POCT GLYCOSYLATED HEMOGLOBIN (HGB A1C): HbA1c, POC (controlled diabetic range): 6.3 % (ref 0.0–7.0)

## 2023-12-13 NOTE — Patient Instructions (Signed)
 Qu?n l t?ng huy?t p Managing Your Hypertension T?ng huy?t p, cn ???c g?i l huy?t p cao, l khi l?c b?m mu p vo thnh ??ng m?ch qu m?nh. Cc ??ng m?ch l cc m?ch mu mang mu t? tim ?i kh?p c? th? qu v?. T?ng huy?t p bu?c tim ph?i lm vi?c nhi?u h?n ?? b?m mu v c th? khi?n cho cc ??ng m?ch b? h?p ho?c c?ng. Tm hi?u v? cc ch? s? huy?t p Ch? s? huy?t p g?m m?t ch? s? cao trn m?t ch? s? th?p: Ch? s? ??u tin, hay ch? s? cao nh?t, ???c g?i l huy?t p tm thu. ?y l s? ?o p su?t trong ??ng m?ch khi tim qu v? ??p. Ch? s? th? hai, hay ch? s? th?p nh?t, ???c g?i l huy?t p tm tr??ng. ?y l s? ?o p su?t trong ??ng m?ch khi tim qu v? gin ra. ??i v?i h?u h?t m?i ng??i, huy?t p bnh th??ng l d??i 120/80. Huy?t p m?c tiu c nhn c?a qu v? c th? khc nhau ty thu?c v tnh tr?ng b?nh l, tu?i v cc y?u t? khc. Huy?t p ???c phn lo?i thnh b?n giai ?o?n. D?a trn ch? s? huy?t p c?a qu v?, chuyn gia ch?m [place] s?c kh?e c?a qu v? c th? s? d?ng nh?ng giai ?o?n sau ?y ?? xc ??nh lo?i ?i?u tr? qu v? c?n, n?u c. Huy?t p tm thu v huy?t p tm tr??ng ???c ?o theo ??n v? mm th?y ngn (mmHg). Bnh th??ng Huy?t p tm thu: d??i 120. Huy?t p tm tr??ng: d??i 80. Cao Huy?t p tm thu: 120-129. Huy?t p tm tr??ng: d??i 80. T?ng huy?t p giai ?o?n 1 Huy?t p tm thu: 130-139. Huy?t p tm tr??ng: 80-89. T?ng huy?t p giai ?o?n 2 Huy?t p tm thu: t? 140 tr? ln. Huy?t p tm tr??ng: t? 90 tr? ln. Tnh tr?ng ny c th? ?nh h??ng ??n ti nh? th? no? X? tr t?ng huy?t p c?a qu v? l r?t quan tr?ng. Theo th?i gian, t?ng huy?t p c th? gy t?n th??ng cc ??ng m?ch v lm gi?m l?u l??ng mu ??n cc b? ph?n c?a c? th?, g?m c? no, tim v th?n. T?ng huy?t p khng ???c ki?m sot ho?c ?i?u tr? c th? d?n t?i: Nh?i mu c? tim. ??t qu?Marland Kitchen M?ch mu b? y?u (phnh m?ch). Suy tim. T?n th??ng th?n. T?n th??ng m?t. Cc v?n ?? v? t?p trung v tr nh?. Sa st tr tu? do m?ch  mu. Ti c th? th??c hi?n nh?ng hnh ??ng no ?? ki?m sot tnh tr?ng ny? C th? qu?n l t?ng huy?t p b?ng cch thay ??i l?i s?ng v c th? l b?ng cch dng thu?c. Chuyn gia ch?m [place] s?c kh?e c?a qu v? s? gip qu v? ln k? ho?ch ??a huy?t p v? gi?i h?n bnh th??ng. Qu v? c th? ???c gi?i thi?u ?? ???c t? v?n v? ch? ?? ?n lnh m?nh v ho?t ??ng th? ch?t. Dinh d??ng  ?n ch? ?? giu ch?t x? v kali v t mu?i (natri), t ???ng b? sung v t ch?t bo. M?t k? ho?ch ?n ki?u m?u ???c g?i l ch? ?? ?n DASH. DASH l vi?t t?t c?a Dietary Approaches to Stop Hypertension (Ph??ng php ti?p c?n ch? ?? ?n u?ng ?? lm gi?m huy?t p). ?n theo cch ny: ?n nhi?u tri cy v rau c? t??i. Vo m?i b?a ?n, c? g?ng dnh m?t n?a ??a cho tri cy v rau c?. ?  n ng? c?c nguyn cm, ch?ng h?n nh? m ?ng lm t? b?t m nguyn cm, g?o l?t, ho?c bnh m t? b?t m nguyn cm. Cho ngu? c?c nguyn ca?m va?o kho?ng m?t ph?n t? ??a. ?n cc s?n ph?m s?a t bo. Trnh nh?ng mi?ng th?t nhi?u m?, th?t ch? bi?n s?n ho?c th?t ??p mu?i v th?t gia c?m c da. Dnh kho?ng m?t ph?n t? ??a c?a qu v? cho cc protein n?c, ch?ng h?n nh? c, th?t g khng da, ??u, tr?ng v ??u ph?. Trnh nh?ng th?c ph?m ch? bi?n s?n ho?c lm s?n. Nh?ng th?c ph?m ny th??ng c nhi?u natri, b? sung ???ng v ch?t bo h?n. Gi?m l??ng dng natri hng ngy c?a qu v?. Nhi?u ng??i b? t?ng huy?t p c?n ?n d??i 1.500 mg natri m?i ngy. L?i s?ng  H?p tc v?i chuyn gia ch?m [place] s?c kh?e c?a qu v? ?? duy tr tr?ng l??ng c? th? c l?i cho s?c kh?e ho?c ?? gi?m cn. Hy h?i xem tr?ng l??ng no l l t??ng cho qu v?. Dnh t nh?t 30 pht t?p th? d?c c th? khi?n tim qu v? ??p nhanh h?n (t?p th? d?c nh?p ?i?u) h?u h?t cc ngy trong tu?n. Cc ho?t ??ng c th? bao g?m ?i b?, b?i, ho?c ??p xe. ??a vo bi t?p t?ng s?c m?nh c? b?p (bi t?p khng l?c), ch?ng h?n nh? nng t?, trong khun kh? thi quen luy?n t?p hng tu?n c?a qu v?. C? g?ng t?p nh?ng lo?i bi t?p ny  trong vng 30 pht, t nh?t l 3 ngy m?i tu?n. Khng s? d?ng b?t k? s?n ph?m no c nicotine ho?c thu?c l. Nh?ng s?n ph?m ny bao g?m thu?c l d?ng ht, thu?c l d?ng nhai v d?ng c? ht thu?c, ch?ng h?n nh? thu?c l ?i?n t?. N?u qu v? c?n gip ?? ?? cai thu?c, hy h?i chuyn gia ch?m [place] s?c kh?e. Ki?m sot b?t k? tnh tr?ng ko di (m?n tnh) no m qu v? c, ch?ng h?n nh? cholesterol cao ho?c ti?u ???ng. Xc ??nh cc ngu?n gy c?ng th?ng v tm cch qu?n l c?ng th?ng. Vi?c ny c th? bao g?m thi?n ??nh, th? su ho?c dnh th?i gian cho cc ho?t ??ng vui v?. S? d?ng r??u Khng u?ng r??u n?u: Chuyn gia ch?m [place] s?c kh?e khuyn qu v? khng u?ng r??u. Qu v? c [place], c th? c [place], ho?c ?ang c k? ho?ch c [place]. N?u qu v? u?ng r??u: Gi?i h?n l??ng r??u qu v? u?ng ? m?c: 0-1 ly/ngy ??i v?i n? gi?i. 0-2 ly/ngy ??i v?i nam gi?i. Bi?t m?t ly c bao nhiu r??u. ? M?, m?t ly t??ng ???ng v?i m?t chai bia 12 ao-x? (355 mL), m?t ly r??u vang 5 ao-x? (148 mL), ho?c m?t ly r??u m?nh 1 ao-x? (44 mL). Thu?c Chuyn gia ch?m [place] s?c kh?e c th? k ??n thu?c n?u thay ??i l?i s?ng khng ?? ?? ??a huy?t p v? m?c c th? ki?m sot ???c v n?u: Huy?t p tm thu c?a qu v? t? 130 tr? ln. Huy?t p tm tr??ng c?a qu v? t? 80 tr? ln. Ch? s? d?ng thu?c theo h??ng d?n c?a chuyn gia ch?m [place] s?c kh?e cu?a quy? vi?. Lm theo ch? d?n m?t cch c?n th?n. Thu?c ?i?u tr? huy?t p ph?i ???c dng theo ch? d?n c?a chuyn gia ch?m [place] s?c kh?e. Thu?c c?ng s? khng c tc d?ng khi qu v? b? dng thu?c. Vi?c b? dng thu?c c?ng lm qu  v? c nguy c? pht sinh v?n ??Danice Goltz di Tr??c khi qu v? theo di huy?t p: Khng ht thu?c, u?ng ?? u?ng ch?a caffein, ho?c t?p th? d?c trong vng 30 pht tr??c khi ?o. ?i v? sinh v ?i ti?u (ti?u ti?n). Ng?i yn t?nh trong t nh?t 5 pht tr??c khi ?o. Theo di huy?t p c?a qu v? t?i nh theo h??ng d?n c?a chuyn gia ch?m [place] s?c kh?e. ?? lm ?i?u ny: Ng?i th?ng v c t?a l?ng. ??  hai bn chn b?ng ph?ng trn sn nh. Khng b?t cho chn. Ch?ng cnh tay trn m?t b? m?t ph?ng, ch?ng h?n nh? bn. ??m b?o r?ng ph?n cnh tay trn c?a qu v? ngang b?ng tim. M?i l?n qu v? ?o, hy l?y hai ho?c ba ch? s? cch nhau m?t pht v ghi l?i cc k?t qu? ?. Qu v? c?ng c th? c?n c chuyn gia ch?m [place] s?c kho? ki?m tra huy?t p c?a qu v? th??ng xuyn. Thng tin chung [place] ??i v?i chuyn gia ch?m [place] s?c kh?e c?a qu v? v? ch? ?? ?n, thi qun luy?n t?p v cc y?u t? l?i s?ng khc c th? gp ph?n lm t?ng huy?t p. Cng v?i chuyn gia ch?m [place] s?c kh?e xem xt l?i t?t c? cc lo?i thu?c qu v? dng b?i v c th? c tc d?ng ph? ho?c t??ng tc thu?c. Tun th? theo t?t c? cc l?n khm l?i. Chuyn gia ch?m [place] s?c kh?e c?a qu v? c th? gip qu v? t?o ra v ?i?u ch?nh k? ho?ch ki?m sot huy?t p cao. N?i tm thm thng tin National Heart, Lung, and Blood Institute (Vi?n Tim, Ph?i v Mu Qu?c gia): [email or web address] American Heart Association (Hi?p h?i Tim m?ch [place]?): www.heart.org Hy lin l?c v?i chuyn gia ch?m [place] s?c kh?e n?u: Qu v? ngh? l qu v? c ph?n ?ng v?i cc lo?i thu?c ? dng. Qu v? b? ?au ??u l?p ?i l?p l?i (ti pht). Qu v? c?m th?y chng m?t. Qu v? b? s?ng ? c? chn. Qu v? c v?n ?? v? th? l?c. Yu c?u tr? gip ngay l?p t?c n?u: Qu v? b? ?au ??u r?t nhi?u ho?c l l?n. Qu v? b? y?u b?t th??ng ho?c t b, ho?c c?m th?y b? ng?t. Qu v? b? ?au r?t nhi?u ? ng?c ho?c b?ng. Qu v? nn nhi?u l?n. Qu v? b? kh th?. Nh?ng tri?u ch?ng ny c th? l tr??ng h?p c?p c?u. Yu c?u tr? gip ngay l?p t?c. Hy g?i 911. Khng ch? xem tri?u ch?ng c h?t khng. Khng t? li xe ??n b?nh vi?n. Tm t?t T?ng huy?t p l khi l?c b?m mu qua cc ??ng m?ch c?a qu v? qu m?nh. N?u tnh tr?ng ny khng ???c ki?m sot, n c th? khi?n qu v? c nguy c? b? cc bi?n ch?ng nghim tr?ng. Huy?t p m?c tiu c nhn c?a qu v? c th? khc nhau ty thu?c v tnh tr?ng b?nh l, tu?i v cc y?u  t? khc. ??i v?i h?u h?t m?i ng??i, huy?t p bnh th??ng l d??i 120/80. Ki?m sot t?ng huy?t p b?ng cch thay ??i l?i s?ng, dng thu?c, ho?c c? hai. Thay ??i l?i s?ng ?? gip ki?m sot t?ng huy?t p bao g?m gi?m cn, ?n ch? ?? ?n c l?i cho s?c kh?e, t natri, t?p th? d?c nhi?u h?n, ng?ng ht thu?c v h?n ch? u?ng r??u. Thng tin ny khng nh?m m?c ?ch thay th? cho l?i [place] m [place] gia  ch?m [place] s?c kh?e ni v?i qu v?. Hy b?o ??m qu v? ph?i th?o lu?n b?t k? v?n ?? g m qu v? c v?i chuyn gia ch?m [place] s?c kh?e c?a qu v?. Document Revised: 04/08/2021 Document Reviewed: 04/08/2021 Elsevier Patient Education  2024 [employer].

## 2023-12-13 NOTE — Progress Notes (Signed)
 Subjective:  Patient ID: Amber Grant, female    DOB: 1960/11/08  Age: 63 y.o. MRN: [id number]  CC: Medical Management of Chronic Issues     Discussed the use of AI scribe software for clinical note transcription with the patient, who gave verbal consent to proceed.  History of Present Illness Amber Grant is a 63 year old female with  a history of hypertension, whitecoat hypertension, hyperlipidemia-type 2 diabetes mellitus  who presents for a follow-up visit.  She experiences anxiety during medical visits, which she believes causes her blood pressure to rise. At home, her blood pressure readings range between 114/74 and 129/73. She continues to take lisinopril  for blood pressure management. Her diabetes is managed through dietary changes, and her recent A1c was 6.3. She did not eat breakfast but had black coffee this morning. She is currently taking atorvastatin  for cholesterol management.    Past Medical History:  Diagnosis Date   Hyperlipidemia    Hypertension     Past Surgical History:  Procedure Laterality Date   CESAREAN SECTION      Family History  Problem Relation Age of Onset   Diabetes Brother    Breast cancer Neg Hx    Colon polyps Neg Hx    Colon cancer Neg Hx    Esophageal cancer Neg Hx    Rectal cancer Neg Hx    Stomach cancer Neg Hx     Social History   Socioeconomic History   Marital status: Married    Spouse name: Not on file   Number of children: Not on file   Years of education: Not on file   Highest education level: Not on file  Occupational History   Not on file  Tobacco Use   Smoking status: Never   Smokeless tobacco: Never  Vaping Use   Vaping status: Never Used  Substance and Sexual Activity   Alcohol use: No   Drug use: No   Sexual activity: Not Currently    Birth control/protection: Post-menopausal  Other Topics Concern   Not on file  Social History Narrative   Not on file   Social Drivers of Health   Financial Resource  Strain: Not on file  Food Insecurity: Not on file  Transportation Needs: Not on file  Physical Activity: Unknown (05/03/2017)   Exercise Vital Sign    Days of Exercise per Week: Patient declined    Minutes of Exercise per Session: Patient declined  Stress: Not on file  Social Connections: Unknown (05/03/2017)   Social Connection and Isolation Panel    Frequency of Communication with Friends and Family: Patient declined    Frequency of Social Gatherings with Friends and Family: Patient declined    Attends Religious Services: Patient declined    Database administrator or Organizations: Patient declined    Attends Banker Meetings: Patient declined    Marital Status: Patient declined    No Known Allergies  Outpatient Medications Prior to Visit  Medication Sig Dispense Refill   atorvastatin  (LIPITOR) 40 MG tablet Take 1 tablet (40 mg total) by mouth daily. 90 tablet 3   Blood Glucose Monitoring Suppl (TRUE METRIX METER) w/Device KIT Use as directed to check blood sugar in the morning. 1 kit 0   cholecalciferol (VITAMIN D ) 1000 UNITS tablet Take 1,000 Units by mouth daily.     glucose blood (TRUE METRIX BLOOD GLUCOSE TEST) test strip Use as instructed to check blood sugar in the morning. 100 each 11  lisinopril  (ZESTRIL ) 40 MG tablet Take 1 tablet (40 mg total) by mouth daily. 90 tablet 3   Multiple Vitamins-Minerals (MULTIVITAMIN PO) Take by mouth.     omega-3 acid ethyl esters (LOVAZA) 1 g capsule Take 1 g by mouth 2 (two) times daily.     TRUEplus Lancets 28G MISC Use to check blood sugar once in the morning. 100 each 11   No facility-administered medications prior to visit.     ROS Review of Systems  Constitutional:  Negative for activity change and appetite change.  HENT:  Negative for sinus pressure and sore throat.   Respiratory:  Negative for chest tightness, shortness of breath and wheezing.   Cardiovascular:  Negative for chest pain and palpitations.   Gastrointestinal:  Negative for abdominal distention, abdominal pain and constipation.  Genitourinary: Negative.   Musculoskeletal: Negative.   Psychiatric/Behavioral:  Negative for behavioral problems and dysphoric mood.     Objective:  BP (!) 152/75   Pulse (!) 105   Ht 5' (1.524 m)   Wt 110 lb 12.8 oz (50.3 kg)   LMP  (LMP Unknown)   SpO2 99%   BMI 21.64 kg/m      12/13/2023    9:57 AM 12/13/2023    9:29 AM 08/08/2023    9:25 AM  BP/Weight  Systolic BP 152 171 121  Diastolic BP 75 80 70  Wt. (Lbs)  110.8   BMI  21.64 kg/m2       Physical Exam Constitutional:      Appearance: She is well-developed.   Cardiovascular:     Rate and Rhythm: Tachycardia present.     Heart sounds: Normal heart sounds. No murmur heard. Pulmonary:     Effort: Pulmonary effort is normal.     Breath sounds: Normal breath sounds. No wheezing or rales.  Chest:     Chest wall: No tenderness.  Abdominal:     General: Bowel sounds are normal. There is no distension.     Palpations: Abdomen is soft. There is no mass.     Tenderness: There is no abdominal tenderness.   Musculoskeletal:        General: Normal range of motion.     Right lower leg: No edema.     Left lower leg: No edema.   Neurological:     Mental Status: She is alert and oriented to person, place, and time.   Psychiatric:        Mood and Affect: Mood normal.        Latest Ref Rng & Units 08/08/2023    9:41 AM 05/03/2023    9:27 AM 10/26/2022   10:53 AM  CMP  Glucose 70 - 99 mg/dL 858  876  870   BUN 8 - 27 mg/dL 14  10  10    Creatinine 0.57 - 1.00 mg/dL 9.36  9.31  9.37   Sodium 134 - 144 mmol/L 139  141  135   Potassium 3.5 - 5.2 mmol/L 5.2  5.2  4.3   Chloride 96 - 106 mmol/L 100  102  98   CO2 20 - 29 mmol/L 22  CANCELED  20   Calcium  8.7 - 10.3 mg/dL 9.4  9.5  9.3   Total Protein 6.0 - 8.5 g/dL  7.9  7.9   Total Bilirubin 0.0 - 1.2 mg/dL  0.5  0.7   Alkaline Phos 44 - 121 IU/L  56  64   AST 0 - 40 IU/L  36  26   ALT 0 - 32 IU/L  25  28     Lipid Panel     Component Value Date/Time   CHOL 150 10/26/2022 1053   TRIG 83 10/26/2022 1053   HDL 68 10/26/2022 1053   CHOLHDL 2.2 07/08/2020 1049   CHOLHDL 2.4 07/13/2016 0936   VLDL 18 07/13/2016 0936   LDLCALC 66 10/26/2022 1053    CBC    Component Value Date/Time   WBC 4.8 07/13/2016 0936   RBC 4.49 07/13/2016 0936   HGB 13.7 07/13/2016 0936   HCT 41.5 07/13/2016 0936   PLT 292 07/13/2016 0936   MCV 92.4 07/13/2016 0936   MCH 30.5 07/13/2016 0936   MCHC 33.0 07/13/2016 0936   RDW 13.2 07/13/2016 0936   LYMPHSABS 1,920 07/13/2016 0936   MONOABS 192 (L) 07/13/2016 0936   EOSABS 192 07/13/2016 0936   BASOSABS 48 07/13/2016 0936    Lab Results  Component Value Date   HGBA1C 6.3 12/13/2023    Lab Results  Component Value Date   HGBA1C 6.3 12/13/2023   HGBA1C 6.0 05/03/2023   HGBA1C 6.5 02/07/2023      1. Type 2 diabetes mellitus with other specified complication, without long-term current use of insulin (HCC) (Primary) Diet controlled with A1c of 6.3 PCV 20 administered today Counseled on Diabetic diet, my plate method, 849 minutes of moderate intensity exercise/week Blood sugar logs with fasting goals of 80-120 mg/dl, random of less than 819 and in the event of sugars less than 60 mg/dl or greater than 599 mg/dl encouraged to notify the clinic. Advised on the need for annual eye exams, annual foot exams, Pneumonia vaccine. - POCT glycosylated hemoglobin (Hb A1C)  2. Hyperlipidemia associated with type 2 diabetes mellitus (HCC) Controlled Continue atorvastatin  Low-cholesterol diet - LP+Non-HDL Cholesterol  3. Hypertension associated with diabetes (HCC) Uncontrolled She does have whitecoat hypertension and anxiety Blood pressure readings at home have been normal hence I will make no changes to her regimen of lisinopril  to prevent hypotension -Counseled on blood pressure goal of less than 130/80, low-sodium, DASH diet,  medication compliance, 150 minutes of moderate intensity exercise per week. Discussed medication compliance, adverse effects.   4. White coat syndrome with diagnosis of hypertension See #3 above  5. Need for Streptococcus pneumoniae vaccination - Pneumococcal conjugate vaccine 20-valent   No orders of the defined types were placed in this encounter.   Follow-up: Return in about 6 months (around 06/14/2024) for Chronic medical conditions.       Corrina Sabin, MD, FAAFP. [facility] [place], [place] [phone]   12/13/2023, 10:20 AM

## 2023-12-14 ENCOUNTER — Ambulatory Visit: Payer: Self-pay | Admitting: Family Medicine

## 2023-12-14 LAB — LP+NON-HDL CHOLESTEROL
Cholesterol, Total: 147 mg/dL (ref 100–199)
HDL: 70 mg/dL (ref >39)
LDL Chol Calc (NIH): 61 mg/dL (ref 0–99)
Total Non-HDL-Chol (LDL+VLDL): 77 mg/dL (ref 0–129)
Triglycerides: 87 mg/dL (ref 0–149)
VLDL Cholesterol Cal: 16 mg/dL (ref 5–40)

## 2024-06-19 ENCOUNTER — Ambulatory Visit: Attending: Family Medicine | Admitting: Family Medicine

## 2024-06-19 ENCOUNTER — Encounter: Payer: Self-pay | Admitting: Family Medicine

## 2024-06-19 VITALS — BP 166/74 | HR 110 | Temp 98.3°F | Ht 60.0 in | Wt 111.8 lb

## 2024-06-19 DIAGNOSIS — I10 Essential (primary) hypertension: Secondary | ICD-10-CM | POA: Diagnosis not present

## 2024-06-19 DIAGNOSIS — E1159 Type 2 diabetes mellitus with other circulatory complications: Secondary | ICD-10-CM

## 2024-06-19 DIAGNOSIS — R000 Tachycardia, unspecified: Secondary | ICD-10-CM | POA: Diagnosis not present

## 2024-06-19 DIAGNOSIS — E785 Hyperlipidemia, unspecified: Secondary | ICD-10-CM | POA: Diagnosis not present

## 2024-06-19 DIAGNOSIS — I152 Hypertension secondary to endocrine disorders: Secondary | ICD-10-CM

## 2024-06-19 DIAGNOSIS — E1169 Type 2 diabetes mellitus with other specified complication: Secondary | ICD-10-CM | POA: Diagnosis not present

## 2024-06-19 LAB — POCT GLYCOSYLATED HEMOGLOBIN (HGB A1C): HbA1c, POC (controlled diabetic range): 6.6 % (ref 0.0–7.0)

## 2024-06-19 MED ORDER — METOPROLOL SUCCINATE ER 25 MG PO TB24: 12.5000 mg | ORAL_TABLET | Freq: Every day | ORAL | 1 refills | Status: AC

## 2024-06-19 MED ORDER — LISINOPRIL 40 MG PO TABS: 40.0000 mg | ORAL_TABLET | Freq: Every day | ORAL | 3 refills | Status: AC

## 2024-06-19 MED ORDER — HYDROCHLOROTHIAZIDE 12.5 MG PO TABS: 12.5000 mg | ORAL_TABLET | Freq: Every day | ORAL | 3 refills | Status: DC

## 2024-06-19 MED ORDER — ATORVASTATIN CALCIUM 40 MG PO TABS: 40.0000 mg | ORAL_TABLET | Freq: Every day | ORAL | 3 refills | Status: AC

## 2024-06-19 NOTE — Progress Notes (Signed)
 "  Subjective:  Patient ID: Amber Grant, female    DOB: 20-Jun-1960  Age: 64 y.o. MRN: [id number]  CC: Medical Management of Chronic Issues (Follow up)     Discussed the use of AI scribe software for clinical note transcription with the patient, who gave verbal consent to proceed.  History of Present Illness Amber My Covey is a 64 year old female with  a history of hypertension, whitecoat hypertension, hyperlipidemia-type 2 diabetes mellitus who presents for follow-up of her medical conditions.  She monitors her blood pressure at home and states readings are normal, but office readings are elevated. She has white coat effect. She takes lisinopril  daily.  Her diabetes is diet controlled. Her recent A1c is 6.6, increased from 6.3. She is not on diabetes medication.  She takes atorvastatin  without side effects.  She notes a sensation of feeling hot on her face but denies other symptoms on review of systems.    Past Medical History:  Diagnosis Date   Hyperlipidemia    Hypertension     Past Surgical History:  Procedure Laterality Date   CESAREAN SECTION      Family History  Problem Relation Age of Onset   Diabetes Brother    Breast cancer Neg Hx    Colon polyps Neg Hx    Colon cancer Neg Hx    Esophageal cancer Neg Hx    Rectal cancer Neg Hx    Stomach cancer Neg Hx     Social History   Socioeconomic History   Marital status: Married    Spouse name: Not on file   Number of children: Not on file   Years of education: Not on file   Highest education level: Not on file  Occupational History   Not on file  Tobacco Use   Smoking status: Never   Smokeless tobacco: Never  Vaping Use   Vaping status: Never Used  Substance and Sexual Activity   Alcohol use: No   Drug use: No   Sexual activity: Not Currently    Birth control/protection: Post-menopausal  Other Topics Concern   Not on file  Social History Narrative   Not on file   Social Drivers of Health   Tobacco  Use: Low Risk (12/13/2023)   Patient History    Smoking Tobacco Use: Never    Smokeless Tobacco Use: Never    Passive Exposure: Not on file  Financial Resource Strain: Not on file  Food Insecurity: Not on file  Transportation Needs: Not on file  Physical Activity: Not on file  Stress: Not on file  Social Connections: Not on file  Depression (PHQ2-9): Low Risk (12/13/2023)   Depression (PHQ2-9)    PHQ-2 Score: 1  Alcohol Screen: Not on file  Housing: Not on file  Utilities: Not on file  Health Literacy: Not on file    Allergies[1]  Outpatient Medications Prior to Visit  Medication Sig Dispense Refill   Blood Glucose Monitoring Suppl (TRUE METRIX METER) w/Device KIT Use as directed to check blood sugar in the morning. 1 kit 0   cholecalciferol (VITAMIN D ) 1000 UNITS tablet Take 1,000 Units by mouth daily.     glucose blood (TRUE METRIX BLOOD GLUCOSE TEST) test strip Use as instructed to check blood sugar in the morning. 100 each 11   Multiple Vitamins-Minerals (MULTIVITAMIN PO) Take by mouth.     omega-3 acid ethyl esters (LOVAZA) 1 g capsule Take 1 g by mouth 2 (two) times daily.  TRUEplus Lancets 28G MISC Use to check blood sugar once in the morning. 100 each 11   atorvastatin  (LIPITOR) 40 MG tablet Take 1 tablet (40 mg total) by mouth daily. 90 tablet 3   lisinopril  (ZESTRIL ) 40 MG tablet Take 1 tablet (40 mg total) by mouth daily. 90 tablet 3   No facility-administered medications prior to visit.     ROS Review of Systems  Constitutional:  Negative for activity change and appetite change.  HENT:  Negative for sinus pressure and sore throat.   Respiratory:  Negative for chest tightness, shortness of breath and wheezing.   Cardiovascular:  Negative for chest pain and palpitations.  Gastrointestinal:  Negative for abdominal distention, abdominal pain and constipation.  Genitourinary: Negative.   Musculoskeletal: Negative.   Psychiatric/Behavioral:  Negative for behavioral  problems and dysphoric mood.     Objective:  BP (!) 166/74   Pulse (!) 110   Temp 98.3 F (36.8 C) (Oral)   Ht 5' (1.524 m)   Wt 111 lb 12.8 oz (50.7 kg)   LMP  (LMP Unknown)   SpO2 100%   BMI 21.83 kg/m      06/19/2024    9:02 AM 06/19/2024    8:42 AM 12/13/2023    9:57 AM  BP/Weight  Systolic BP 166 168 152  Diastolic BP 74 79 75  Wt. (Lbs)  111.8   BMI  21.83 kg/m2       Physical Exam Constitutional:      Appearance: She is well-developed.  Cardiovascular:     Rate and Rhythm: Tachycardia present.     Heart sounds: Normal heart sounds. No murmur heard. Pulmonary:     Effort: Pulmonary effort is normal.     Breath sounds: Normal breath sounds. No wheezing or rales.  Chest:     Chest wall: No tenderness.  Abdominal:     General: Bowel sounds are normal. There is no distension.     Palpations: Abdomen is soft. There is no mass.     Tenderness: There is no abdominal tenderness.  Musculoskeletal:        General: Normal range of motion.     Right lower leg: No edema.     Left lower leg: No edema.  Neurological:     Mental Status: She is alert and oriented to person, place, and time.  Psychiatric:        Mood and Affect: Mood normal.        Latest Ref Rng & Units 08/08/2023    9:41 AM 05/03/2023    9:27 AM 10/26/2022   10:53 AM  CMP  Glucose 70 - 99 mg/dL 858  876  870   BUN 8 - 27 mg/dL 14  10  10    Creatinine 0.57 - 1.00 mg/dL 9.36  9.31  9.37   Sodium 134 - 144 mmol/L 139  141  135   Potassium 3.5 - 5.2 mmol/L 5.2  5.2  4.3   Chloride 96 - 106 mmol/L 100  102  98   CO2 20 - 29 mmol/L 22  CANCELED  20   Calcium  8.7 - 10.3 mg/dL 9.4  9.5  9.3   Total Protein 6.0 - 8.5 g/dL  7.9  7.9   Total Bilirubin 0.0 - 1.2 mg/dL  0.5  0.7   Alkaline Phos 44 - 121 IU/L  56  64   AST 0 - 40 IU/L  36  26   ALT 0 - 32 IU/L  25  28     Lipid Panel     Component Value Date/Time   CHOL 147 12/13/2023 1018   TRIG 87 12/13/2023 1018   HDL 70 12/13/2023 1018   CHOLHDL  2.2 07/08/2020 1049   CHOLHDL 2.4 07/13/2016 0936   VLDL 18 07/13/2016 0936   LDLCALC 61 12/13/2023 1018    CBC    Component Value Date/Time   WBC 4.8 07/13/2016 0936   RBC 4.49 07/13/2016 0936   HGB 13.7 07/13/2016 0936   HCT 41.5 07/13/2016 0936   PLT 292 07/13/2016 0936   MCV 92.4 07/13/2016 0936   MCH 30.5 07/13/2016 0936   MCHC 33.0 07/13/2016 0936   RDW 13.2 07/13/2016 0936   LYMPHSABS 1,920 07/13/2016 0936   MONOABS 192 (L) 07/13/2016 0936   EOSABS 192 07/13/2016 0936   BASOSABS 48 07/13/2016 0936    Lab Results  Component Value Date   HGBA1C 6.6 06/19/2024    Lab Results  Component Value Date   TSH 1.976 08/23/2013       Assessment & Plan Hypertension associated with diabetes/ white coat Hypertension Blood pressure elevated despite lisinopril  40 mg daily. Suspected white coat hypertension due to normal home readings. Initiated additional therapy for persistent high office readings. - Added low-dose Toprol  XL (which will also help with tachycardia) to lisinopril  regimen. - Rechecked blood pressure before clinic departure. -Notify Clinic if signs of hypotension occur -Counseled on blood pressure goal of less than 130/80, low-sodium, DASH diet, medication compliance, 150 minutes of moderate intensity exercise per week. Discussed medication compliance, adverse effects.   Type 2 diabetes mellitus with other specified complication A1c increased from 6.3 to 6.6, indicating need for closer monitoring. Managed with diet control. - Continue monitoring A1c levels. - Consider medication if A1c rises.  Hyperlipidemia associated with type 2 diabetes mellitus Managed with atorvastatin  40 mg daily. - Continue atorvastatin  40 mg daily.  Tachycardia -She is nervous during Doctor visits which can also explain her facial warmth -Will check thyroid labs -Adding Metoprolol  will be beneficial  General Health Maintenance Completed Colonoscopy       Meds ordered  this encounter  Medications   atorvastatin  (LIPITOR) 40 MG tablet    Sig: Take 1 tablet (40 mg total) by mouth daily.    Dispense:  90 tablet    Refill:  3    Please dispense #90   lisinopril  (ZESTRIL ) 40 MG tablet    Sig: Take 1 tablet (40 mg total) by mouth daily.    Dispense:  90 tablet    Refill:  3    Dispense #90   DISCONTD: hydrochlorothiazide  (HYDRODIURIL ) 12.5 MG tablet    Sig: Take 1 tablet (12.5 mg total) by mouth daily.    Dispense:  90 tablet    Refill:  3   metoprolol  succinate (TOPROL -XL) 25 MG 24 hr tablet    Sig: Take 0.5 tablets (12.5 mg total) by mouth daily.    Dispense:  45 tablet    Refill:  1    Discontinue HCTZ    Follow-up: Return in about 6 months (around 12/17/2024) for Chronic medical conditions.       Corrina Sabin, MD, FAAFP. [facility] [place], [place] [phone]   06/19/2024, 10:12 AM     [1] No Known Allergies  "

## 2024-06-19 NOTE — Patient Instructions (Addendum)
 VISIT SUMMARY:  Today, we reviewed your hypertension, diabetes, and cholesterol management. We also discussed your recent lab results and general health maintenance.  YOUR PLAN:  -HYPERTENSION: Hypertension means high blood pressure. Your blood pressure readings at home are normal, but they are high in the office, likely due to white coat effect. We have added a low-dose Metoprolol  to your current lisinopril  regimen to help manage your blood pressure. We also rechecked your blood pressure before you left the clinic.  -TYPE 2 DIABETES MELLITUS: Type 2 diabetes is a condition where your blood sugar levels are higher than normal. Your recent A1c has increased from 6.3 to 6.6, so we need to monitor it more closely. Continue managing your diabetes with diet control, and we will consider medication if your A1c rises further.  -HYPERLIPIDEMIA: Hyperlipidemia means you have high levels of fats (lipids) in your blood. This is being managed with atorvastatin , which you should continue taking at 40 mg daily.  -GENERAL HEALTH MAINTENANCE: You are due for colon cancer screening. We have ensured that your referral to a gastroenterologist for this screening is in place.  INSTRUCTIONS:  Please follow up with the gastroenterologist for your colon cancer screening as soon as possible. Continue monitoring your blood pressure at home and keep a log of your readings. We will recheck your A1c levels in a few months to see if any changes are needed. If you experience any new symptoms or have concerns, please contact our office.

## 2024-06-20 ENCOUNTER — Ambulatory Visit: Payer: Self-pay | Admitting: Family Medicine

## 2024-06-20 LAB — T4, FREE: Free T4: 1.57 ng/dL (ref 0.82–1.77)

## 2024-06-20 LAB — MICROALBUMIN / CREATININE URINE RATIO
Creatinine, Urine: 11.9 mg/dL
Microalb/Creat Ratio: 28 mg/g{creat} (ref 0–29)
Microalbumin, Urine: 3.3 ug/mL

## 2024-06-20 LAB — CMP14+EGFR
ALT: 31 IU/L (ref 0–32)
AST: 24 IU/L (ref 0–40)
Albumin: 4.7 g/dL (ref 3.9–4.9)
Alkaline Phosphatase: 69 IU/L (ref 49–135)
BUN/Creatinine Ratio: 13 (ref 12–28)
BUN: 8 mg/dL (ref 8–27)
Bilirubin Total: 1 mg/dL (ref 0.0–1.2)
CO2: 23 mmol/L (ref 20–29)
Calcium: 9.2 mg/dL (ref 8.7–10.3)
Chloride: 89 mmol/L — ABNORMAL LOW (ref 96–106)
Creatinine, Ser: 0.6 mg/dL (ref 0.57–1.00)
Globulin, Total: 2.9 g/dL (ref 1.5–4.5)
Glucose: 128 mg/dL — ABNORMAL HIGH (ref 70–99)
Potassium: 4.8 mmol/L (ref 3.5–5.2)
Sodium: 127 mmol/L — ABNORMAL LOW (ref 134–144)
Total Protein: 7.6 g/dL (ref 6.0–8.5)
eGFR: 102 mL/min/1.73

## 2024-06-20 LAB — LP+NON-HDL CHOLESTEROL
Cholesterol, Total: 140 mg/dL (ref 100–199)
HDL: 59 mg/dL
LDL Chol Calc (NIH): 65 mg/dL (ref 0–99)
Total Non-HDL-Chol (LDL+VLDL): 81 mg/dL (ref 0–129)
Triglycerides: 85 mg/dL (ref 0–149)
VLDL Cholesterol Cal: 16 mg/dL (ref 5–40)

## 2024-06-20 LAB — TSH: TSH: 0.908 u[IU]/mL (ref 0.450–4.500)

## 2024-06-20 LAB — T3: T3, Total: 115 ng/dL (ref 71–180)

## 2024-06-26 ENCOUNTER — Encounter: Payer: Self-pay | Admitting: Family Medicine

## 2024-06-27 ENCOUNTER — Other Ambulatory Visit: Payer: Self-pay | Admitting: Family Medicine

## 2024-06-27 DIAGNOSIS — E871 Hypo-osmolality and hyponatremia: Secondary | ICD-10-CM

## 2024-07-05 ENCOUNTER — Ambulatory Visit: Payer: Self-pay | Attending: Family Medicine

## 2024-07-05 DIAGNOSIS — E871 Hypo-osmolality and hyponatremia: Secondary | ICD-10-CM

## 2024-07-06 ENCOUNTER — Ambulatory Visit: Payer: Self-pay | Admitting: Family Medicine

## 2024-07-06 LAB — BASIC METABOLIC PANEL WITH GFR
BUN/Creatinine Ratio: 16 (ref 12–28)
BUN: 10 mg/dL (ref 8–27)
CO2: 20 mmol/L (ref 20–29)
Calcium: 9.1 mg/dL (ref 8.7–10.3)
Chloride: 100 mmol/L (ref 96–106)
Creatinine, Ser: 0.61 mg/dL (ref 0.57–1.00)
Glucose: 107 mg/dL — ABNORMAL HIGH (ref 70–99)
Potassium: 4 mmol/L (ref 3.5–5.2)
Sodium: 137 mmol/L (ref 134–144)
eGFR: 101 mL/min/1.73

## 2024-12-19 ENCOUNTER — Ambulatory Visit: Payer: Self-pay | Admitting: Family Medicine
